# Patient Record
Sex: Female | Born: 1955 | Race: White | Hispanic: No | Marital: Married | Smoking: Never smoker
Health system: Southern US, Community
[De-identification: ages and names within clinical notes are randomized; demographics above are authoritative.]

## PROBLEM LIST (undated history)

## (undated) DIAGNOSIS — M1990 Unspecified osteoarthritis, unspecified site: Secondary | ICD-10-CM

## (undated) DIAGNOSIS — I8290 Acute embolism and thrombosis of unspecified vein: Secondary | ICD-10-CM

## (undated) HISTORY — PX: OTHER SURGICAL HISTORY: SHX169

## (undated) HISTORY — PX: GASTRIC BYPASS: SHX52

## (undated) HISTORY — PX: SHOULDER SURGERY: SHX246

---

## 2002-06-26 ENCOUNTER — Other Ambulatory Visit: Admission: RE | Admit: 2002-06-26 | Discharge: 2002-06-26 | Payer: Self-pay | Admitting: Obstetrics and Gynecology

## 2002-07-02 ENCOUNTER — Encounter: Payer: Self-pay | Admitting: Obstetrics and Gynecology

## 2002-07-02 ENCOUNTER — Ambulatory Visit (HOSPITAL_COMMUNITY): Admission: RE | Admit: 2002-07-02 | Discharge: 2002-07-02 | Payer: Self-pay | Admitting: Obstetrics and Gynecology

## 2003-08-12 ENCOUNTER — Ambulatory Visit (HOSPITAL_COMMUNITY): Admission: RE | Admit: 2003-08-12 | Discharge: 2003-08-12 | Payer: Self-pay | Admitting: Obstetrics and Gynecology

## 2003-08-24 ENCOUNTER — Other Ambulatory Visit: Admission: RE | Admit: 2003-08-24 | Discharge: 2003-08-24 | Payer: Self-pay | Admitting: Obstetrics and Gynecology

## 2003-08-28 ENCOUNTER — Ambulatory Visit (HOSPITAL_COMMUNITY): Admission: RE | Admit: 2003-08-28 | Discharge: 2003-08-28 | Payer: Self-pay | Admitting: Obstetrics and Gynecology

## 2003-09-21 ENCOUNTER — Ambulatory Visit (HOSPITAL_COMMUNITY): Admission: RE | Admit: 2003-09-21 | Discharge: 2003-09-21 | Payer: Self-pay | Admitting: Obstetrics and Gynecology

## 2003-09-22 ENCOUNTER — Encounter: Admission: RE | Admit: 2003-09-22 | Discharge: 2003-09-22 | Payer: Self-pay | Admitting: Family Medicine

## 2004-08-09 ENCOUNTER — Ambulatory Visit: Payer: Self-pay | Admitting: Family Medicine

## 2004-11-25 ENCOUNTER — Ambulatory Visit: Payer: Self-pay | Admitting: Family Medicine

## 2004-11-25 ENCOUNTER — Other Ambulatory Visit: Admission: RE | Admit: 2004-11-25 | Discharge: 2004-11-25 | Payer: Self-pay | Admitting: Family Medicine

## 2004-12-06 ENCOUNTER — Ambulatory Visit (HOSPITAL_COMMUNITY): Admission: RE | Admit: 2004-12-06 | Discharge: 2004-12-06 | Payer: Self-pay | Admitting: Family Medicine

## 2004-12-16 ENCOUNTER — Ambulatory Visit: Payer: Self-pay | Admitting: Family Medicine

## 2004-12-22 ENCOUNTER — Encounter: Admission: RE | Admit: 2004-12-22 | Discharge: 2005-03-22 | Payer: Self-pay | Admitting: Family Medicine

## 2006-01-25 ENCOUNTER — Ambulatory Visit: Payer: Self-pay | Admitting: Family Medicine

## 2006-05-24 ENCOUNTER — Ambulatory Visit: Payer: Self-pay | Admitting: Family Medicine

## 2006-06-13 ENCOUNTER — Ambulatory Visit (HOSPITAL_BASED_OUTPATIENT_CLINIC_OR_DEPARTMENT_OTHER): Admission: RE | Admit: 2006-06-13 | Discharge: 2006-06-13 | Payer: Self-pay | Admitting: Surgery

## 2006-06-17 ENCOUNTER — Ambulatory Visit: Payer: Self-pay | Admitting: Internal Medicine

## 2006-06-19 ENCOUNTER — Ambulatory Visit (HOSPITAL_COMMUNITY): Admission: RE | Admit: 2006-06-19 | Discharge: 2006-06-19 | Payer: Self-pay | Admitting: Surgery

## 2006-06-19 ENCOUNTER — Encounter: Admission: RE | Admit: 2006-06-19 | Discharge: 2006-09-17 | Payer: Self-pay | Admitting: Surgery

## 2006-06-20 ENCOUNTER — Ambulatory Visit (HOSPITAL_COMMUNITY): Admission: RE | Admit: 2006-06-20 | Discharge: 2006-06-20 | Payer: Self-pay | Admitting: Surgery

## 2006-07-04 ENCOUNTER — Other Ambulatory Visit: Admission: RE | Admit: 2006-07-04 | Discharge: 2006-07-04 | Payer: Self-pay | Admitting: Family Medicine

## 2006-07-04 ENCOUNTER — Encounter: Payer: Self-pay | Admitting: Family Medicine

## 2006-07-04 ENCOUNTER — Ambulatory Visit: Payer: Self-pay | Admitting: Family Medicine

## 2006-07-04 LAB — CONVERTED CEMR LAB
Albumin: 3.6 g/dL (ref 3.5–5.2)
Alkaline Phosphatase: 57 units/L (ref 39–117)
BUN: 10 mg/dL (ref 6–23)
Basophils Relative: 0.7 % (ref 0.0–1.0)
Bilirubin, Direct: 0.1 mg/dL (ref 0.0–0.3)
Calcium: 8.8 mg/dL (ref 8.4–10.5)
Eosinophils Relative: 1.3 % (ref 0.0–5.0)
GFR calc Af Amer: 86 mL/min
HCT: 41.5 % (ref 36.0–46.0)
Hemoglobin: 14.3 g/dL (ref 12.0–15.0)
MCV: 87.7 fL (ref 78.0–100.0)
Monocytes Absolute: 0.3 10*3/uL (ref 0.2–0.7)
Neutrophils Relative %: 68.8 % (ref 43.0–77.0)
Platelets: 275 10*3/uL (ref 150–400)
Potassium: 3.7 meq/L (ref 3.5–5.1)
RDW: 13.4 % (ref 11.5–14.6)
Sodium: 142 meq/L (ref 135–145)
T3, Free: 3.2 pg/mL (ref 2.3–4.2)
TSH: 1.79 microintl units/mL (ref 0.35–5.50)
Triglycerides: 105 mg/dL (ref 0–149)
VLDL: 21 mg/dL (ref 0–40)
WBC: 6.2 10*3/uL (ref 4.5–10.5)

## 2006-07-25 ENCOUNTER — Ambulatory Visit: Payer: Self-pay | Admitting: Family Medicine

## 2006-08-27 ENCOUNTER — Ambulatory Visit: Payer: Self-pay | Admitting: Family Medicine

## 2006-09-24 DIAGNOSIS — I868 Varicose veins of other specified sites: Secondary | ICD-10-CM | POA: Insufficient documentation

## 2006-09-24 DIAGNOSIS — N946 Dysmenorrhea, unspecified: Secondary | ICD-10-CM

## 2006-09-24 DIAGNOSIS — E785 Hyperlipidemia, unspecified: Secondary | ICD-10-CM

## 2006-09-25 ENCOUNTER — Encounter: Admission: RE | Admit: 2006-09-25 | Discharge: 2006-12-24 | Payer: Self-pay | Admitting: Surgery

## 2006-09-26 ENCOUNTER — Ambulatory Visit: Payer: Self-pay | Admitting: Family Medicine

## 2006-10-24 ENCOUNTER — Ambulatory Visit: Payer: Self-pay | Admitting: Family Medicine

## 2006-10-24 DIAGNOSIS — E669 Obesity, unspecified: Secondary | ICD-10-CM

## 2006-11-26 ENCOUNTER — Telehealth: Payer: Self-pay | Admitting: Family Medicine

## 2007-01-01 ENCOUNTER — Encounter: Admission: RE | Admit: 2007-01-01 | Discharge: 2007-01-01 | Payer: Self-pay | Admitting: Surgery

## 2007-01-04 ENCOUNTER — Telehealth (INDEPENDENT_AMBULATORY_CARE_PROVIDER_SITE_OTHER): Payer: Self-pay | Admitting: *Deleted

## 2007-01-07 ENCOUNTER — Encounter: Payer: Self-pay | Admitting: Family Medicine

## 2007-01-15 ENCOUNTER — Inpatient Hospital Stay (HOSPITAL_COMMUNITY): Admission: RE | Admit: 2007-01-15 | Discharge: 2007-01-17 | Payer: Self-pay | Admitting: Surgery

## 2007-01-22 ENCOUNTER — Encounter: Admission: RE | Admit: 2007-01-22 | Discharge: 2007-04-22 | Payer: Self-pay | Admitting: Surgery

## 2007-01-24 ENCOUNTER — Telehealth (INDEPENDENT_AMBULATORY_CARE_PROVIDER_SITE_OTHER): Payer: Self-pay | Admitting: *Deleted

## 2007-05-06 ENCOUNTER — Encounter: Admission: RE | Admit: 2007-05-06 | Discharge: 2007-05-07 | Payer: Self-pay | Admitting: Surgery

## 2007-06-27 ENCOUNTER — Ambulatory Visit (HOSPITAL_COMMUNITY): Admission: RE | Admit: 2007-06-27 | Discharge: 2007-06-27 | Payer: Self-pay | Admitting: Family Medicine

## 2007-07-08 ENCOUNTER — Encounter: Admission: RE | Admit: 2007-07-08 | Discharge: 2007-07-08 | Payer: Self-pay | Admitting: Family Medicine

## 2007-07-31 ENCOUNTER — Encounter: Admission: RE | Admit: 2007-07-31 | Discharge: 2007-07-31 | Payer: Self-pay | Admitting: Surgery

## 2007-08-19 ENCOUNTER — Telehealth (INDEPENDENT_AMBULATORY_CARE_PROVIDER_SITE_OTHER): Payer: Self-pay | Admitting: *Deleted

## 2007-11-12 ENCOUNTER — Other Ambulatory Visit: Admission: RE | Admit: 2007-11-12 | Discharge: 2007-11-12 | Payer: Self-pay | Admitting: Family Medicine

## 2007-11-12 ENCOUNTER — Telehealth: Payer: Self-pay | Admitting: Family Medicine

## 2007-11-12 ENCOUNTER — Encounter: Payer: Self-pay | Admitting: Family Medicine

## 2007-11-12 ENCOUNTER — Ambulatory Visit: Payer: Self-pay | Admitting: Family Medicine

## 2007-11-12 DIAGNOSIS — D239 Other benign neoplasm of skin, unspecified: Secondary | ICD-10-CM | POA: Insufficient documentation

## 2007-11-18 ENCOUNTER — Encounter (INDEPENDENT_AMBULATORY_CARE_PROVIDER_SITE_OTHER): Payer: Self-pay | Admitting: *Deleted

## 2007-11-20 ENCOUNTER — Ambulatory Visit: Payer: Self-pay | Admitting: Family Medicine

## 2007-11-21 ENCOUNTER — Telehealth: Payer: Self-pay | Admitting: Family Medicine

## 2007-11-21 LAB — CONVERTED CEMR LAB
ALT: 16 units/L (ref 0–35)
AST: 16 units/L (ref 0–37)
Albumin: 3.3 g/dL — ABNORMAL LOW (ref 3.5–5.2)
Basophils Absolute: 0 10*3/uL (ref 0.0–0.1)
CO2: 29 meq/L (ref 19–32)
Chloride: 107 meq/L (ref 96–112)
Creatinine, Ser: 0.9 mg/dL (ref 0.4–1.2)
Eosinophils Absolute: 0.1 10*3/uL (ref 0.0–0.7)
Eosinophils Relative: 1.6 % (ref 0.0–5.0)
GFR calc Af Amer: 85 mL/min
GFR calc non Af Amer: 70 mL/min
Glucose, Bld: 88 mg/dL (ref 70–99)
HDL: 42.8 mg/dL (ref >39.0)
Hemoglobin: 12.8 g/dL (ref 12.0–15.0)
Iron: 72 ug/dL (ref 42–145)
Lymphocytes Relative: 24.1 % (ref 12.0–46.0)
MCHC: 34.1 g/dL (ref 30.0–36.0)
Monocytes Relative: 3.9 % (ref 3.0–12.0)
Potassium: 3.7 meq/L (ref 3.5–5.1)
RBC: 4.21 M/uL (ref 3.87–5.11)
Saturation Ratios: 26.2 % (ref 20.0–50.0)
Sodium: 141 meq/L (ref 135–145)
Total Bilirubin: 0.6 mg/dL (ref 0.3–1.2)
VLDL: 16 mg/dL (ref 0–40)
Vitamin B-12: 361 pg/mL (ref 211–911)
WBC: 6.1 10*3/uL (ref 4.5–10.5)

## 2007-11-25 ENCOUNTER — Encounter: Payer: Self-pay | Admitting: Family Medicine

## 2007-11-26 ENCOUNTER — Ambulatory Visit: Payer: Self-pay | Admitting: Gastroenterology

## 2007-11-27 ENCOUNTER — Encounter: Payer: Self-pay | Admitting: Family Medicine

## 2007-12-09 ENCOUNTER — Ambulatory Visit: Payer: Self-pay | Admitting: Gastroenterology

## 2007-12-09 ENCOUNTER — Encounter: Payer: Self-pay | Admitting: Gastroenterology

## 2007-12-11 ENCOUNTER — Encounter: Payer: Self-pay | Admitting: Gastroenterology

## 2007-12-12 ENCOUNTER — Telehealth (INDEPENDENT_AMBULATORY_CARE_PROVIDER_SITE_OTHER): Payer: Self-pay | Admitting: *Deleted

## 2007-12-16 ENCOUNTER — Ambulatory Visit: Payer: Self-pay | Admitting: Family Medicine

## 2007-12-16 LAB — CONVERTED CEMR LAB: TSH: 1.38 microintl units/mL (ref 0.35–5.50)

## 2007-12-17 ENCOUNTER — Encounter (INDEPENDENT_AMBULATORY_CARE_PROVIDER_SITE_OTHER): Payer: Self-pay | Admitting: *Deleted

## 2007-12-19 ENCOUNTER — Encounter (INDEPENDENT_AMBULATORY_CARE_PROVIDER_SITE_OTHER): Payer: Self-pay | Admitting: *Deleted

## 2007-12-19 LAB — CONVERTED CEMR LAB: Vit D, 1,25-Dihydroxy: 26 — ABNORMAL LOW (ref 30–89)

## 2008-01-07 ENCOUNTER — Encounter: Admission: RE | Admit: 2008-01-07 | Discharge: 2008-02-11 | Payer: Self-pay | Admitting: Surgery

## 2008-03-04 ENCOUNTER — Ambulatory Visit: Payer: Self-pay | Admitting: Family Medicine

## 2008-03-06 ENCOUNTER — Telehealth (INDEPENDENT_AMBULATORY_CARE_PROVIDER_SITE_OTHER): Payer: Self-pay | Admitting: *Deleted

## 2008-03-18 ENCOUNTER — Encounter: Payer: Self-pay | Admitting: Family Medicine

## 2008-04-07 ENCOUNTER — Ambulatory Visit: Payer: Self-pay | Admitting: Family Medicine

## 2008-04-09 ENCOUNTER — Encounter (INDEPENDENT_AMBULATORY_CARE_PROVIDER_SITE_OTHER): Payer: Self-pay | Admitting: *Deleted

## 2008-05-11 ENCOUNTER — Telehealth (INDEPENDENT_AMBULATORY_CARE_PROVIDER_SITE_OTHER): Payer: Self-pay | Admitting: *Deleted

## 2008-08-12 ENCOUNTER — Encounter: Payer: Self-pay | Admitting: Family Medicine

## 2008-10-01 ENCOUNTER — Ambulatory Visit: Payer: Self-pay | Admitting: Diagnostic Radiology

## 2008-10-01 ENCOUNTER — Emergency Department (HOSPITAL_BASED_OUTPATIENT_CLINIC_OR_DEPARTMENT_OTHER): Admission: EM | Admit: 2008-10-01 | Discharge: 2008-10-01 | Payer: Self-pay | Admitting: Emergency Medicine

## 2008-10-27 ENCOUNTER — Ambulatory Visit (HOSPITAL_COMMUNITY): Admission: RE | Admit: 2008-10-27 | Discharge: 2008-10-27 | Payer: Self-pay | Admitting: Family Medicine

## 2008-12-02 ENCOUNTER — Ambulatory Visit: Payer: Self-pay | Admitting: Family Medicine

## 2008-12-02 ENCOUNTER — Other Ambulatory Visit: Admission: RE | Admit: 2008-12-02 | Discharge: 2008-12-02 | Payer: Self-pay | Admitting: Family Medicine

## 2008-12-02 ENCOUNTER — Encounter: Payer: Self-pay | Admitting: Family Medicine

## 2008-12-04 ENCOUNTER — Encounter (INDEPENDENT_AMBULATORY_CARE_PROVIDER_SITE_OTHER): Payer: Self-pay | Admitting: *Deleted

## 2008-12-08 ENCOUNTER — Encounter (INDEPENDENT_AMBULATORY_CARE_PROVIDER_SITE_OTHER): Payer: Self-pay | Admitting: *Deleted

## 2009-03-24 ENCOUNTER — Encounter: Payer: Self-pay | Admitting: Family Medicine

## 2009-03-30 ENCOUNTER — Encounter: Admission: RE | Admit: 2009-03-30 | Discharge: 2009-03-30 | Payer: Self-pay | Admitting: Sports Medicine

## 2009-04-26 ENCOUNTER — Encounter: Payer: Self-pay | Admitting: Family Medicine

## 2009-06-25 ENCOUNTER — Encounter: Payer: Self-pay | Admitting: Family Medicine

## 2009-09-22 ENCOUNTER — Encounter: Payer: Self-pay | Admitting: Family Medicine

## 2009-10-28 ENCOUNTER — Ambulatory Visit (HOSPITAL_COMMUNITY): Admission: RE | Admit: 2009-10-28 | Discharge: 2009-10-28 | Payer: Self-pay | Admitting: Family Medicine

## 2009-12-03 ENCOUNTER — Ambulatory Visit: Payer: Self-pay | Admitting: Family Medicine

## 2009-12-03 ENCOUNTER — Other Ambulatory Visit: Admission: RE | Admit: 2009-12-03 | Discharge: 2009-12-03 | Payer: Self-pay | Admitting: Family Medicine

## 2009-12-03 DIAGNOSIS — E559 Vitamin D deficiency, unspecified: Secondary | ICD-10-CM | POA: Insufficient documentation

## 2009-12-08 ENCOUNTER — Ambulatory Visit: Payer: Self-pay | Admitting: Family Medicine

## 2009-12-08 DIAGNOSIS — IMO0002 Reserved for concepts with insufficient information to code with codable children: Secondary | ICD-10-CM

## 2009-12-09 LAB — CONVERTED CEMR LAB
ALT: 12 units/L (ref 0–35)
Basophils Absolute: 0 10*3/uL (ref 0.0–0.1)
Basophils Relative: 0.4 % (ref 0.0–3.0)
Bilirubin, Direct: 0.1 mg/dL (ref 0.0–0.3)
Chloride: 104 meq/L (ref 96–112)
Eosinophils Absolute: 0.1 10*3/uL (ref 0.0–0.7)
GFR calc non Af Amer: 74.39 mL/min (ref >60)
Glucose, Bld: 95 mg/dL (ref 70–99)
Hemoglobin: 13.4 g/dL (ref 12.0–15.0)
MCV: 91.4 fL (ref 78.0–100.0)
Neutro Abs: 4.9 10*3/uL (ref 1.4–7.7)
Neutrophils Relative %: 72 % (ref 43.0–77.0)
Platelets: 228 10*3/uL (ref 150.0–400.0)
RDW: 14.1 % (ref 11.5–14.6)
Total Protein: 6.5 g/dL (ref 6.0–8.3)
WBC: 6.8 10*3/uL (ref 4.5–10.5)

## 2009-12-10 LAB — CONVERTED CEMR LAB: Rhuematoid fact SerPl-aCnc: <20 intl units/mL (ref 0–20)

## 2009-12-13 ENCOUNTER — Encounter: Admission: RE | Admit: 2009-12-13 | Discharge: 2009-12-13 | Payer: Self-pay | Admitting: Family Medicine

## 2009-12-14 ENCOUNTER — Telehealth (INDEPENDENT_AMBULATORY_CARE_PROVIDER_SITE_OTHER): Payer: Self-pay | Admitting: *Deleted

## 2009-12-29 ENCOUNTER — Telehealth: Payer: Self-pay | Admitting: Family Medicine

## 2010-01-06 ENCOUNTER — Encounter: Payer: Self-pay | Admitting: Family Medicine

## 2010-01-31 ENCOUNTER — Encounter: Payer: Self-pay | Admitting: Family Medicine

## 2010-06-12 ENCOUNTER — Encounter: Payer: Self-pay | Admitting: Surgery

## 2010-06-19 LAB — CONVERTED CEMR LAB
ALT: 17 units/L (ref 0–35)
ALT: 20 units/L (ref 0–35)
AST: 23 units/L (ref 0–37)
AST: 23 units/L (ref 0–37)
Albumin: 3.8 g/dL (ref 3.5–5.2)
Alkaline Phosphatase: 69 units/L (ref 39–117)
Basophils Absolute: 0 10*3/uL (ref 0.0–0.1)
Basophils Absolute: 0 10*3/uL (ref 0.0–0.1)
Basophils Relative: 0.1 % (ref 0.0–3.0)
Basophils Relative: 0.5 % (ref 0.0–3.0)
Bilirubin Urine: NEGATIVE
Bilirubin, Direct: 0.2 mg/dL (ref 0.0–0.3)
Bilirubin, Direct: 0.2 mg/dL (ref 0.0–0.3)
CO2: 29 meq/L (ref 19–32)
Chloride: 108 meq/L (ref 96–112)
Eosinophils Absolute: 0.1 10*3/uL (ref 0.0–0.7)
Eosinophils Relative: 0.6 % (ref 0.0–5.0)
Eosinophils Relative: 0.9 % (ref 0.0–5.0)
Ferritin: 64.9 ng/mL (ref 10.0–291.0)
Folate: >20 ng/mL
Glucose, Bld: 87 mg/dL (ref 70–99)
Glucose, Bld: 97 mg/dL (ref 70–99)
Glucose, Urine, Semiquant: NEGATIVE
Iron: 103 ug/dL (ref 42–145)
Ketones, urine, test strip: NEGATIVE
Lymphocytes Relative: 18.8 % (ref 12.0–46.0)
Lymphs Abs: 1.4 10*3/uL (ref 0.7–4.0)
MCHC: 33.9 g/dL (ref 30.0–36.0)
MCHC: 34.7 g/dL (ref 30.0–36.0)
MCV: 92.2 fL (ref 78.0–100.0)
Monocytes Absolute: 0.3 10*3/uL (ref 0.1–1.0)
Monocytes Relative: 3.4 % (ref 3.0–12.0)
Monocytes Relative: 4.7 % (ref 3.0–12.0)
Neutro Abs: 4.3 10*3/uL (ref 1.4–7.7)
Neutrophils Relative %: 77.1 % — ABNORMAL HIGH (ref 43.0–77.0)
Pap Smear: NEGATIVE
Platelets: 218 10*3/uL (ref 150.0–400.0)
Potassium: 3.9 meq/L (ref 3.5–5.1)
RBC: 4.13 M/uL (ref 3.87–5.11)
RDW: 13.8 % (ref 11.5–14.6)
Sodium: 141 meq/L (ref 135–145)
TSH: 1.28 microintl units/mL (ref 0.35–5.50)
Total Bilirubin: 1 mg/dL (ref 0.3–1.2)
Total CHOL/HDL Ratio: 3
Total Protein: 6.8 g/dL (ref 6.0–8.3)
Transferrin: 228.7 mg/dL (ref 212.0–360.0)
Triglycerides: 57 mg/dL (ref 0.0–149.0)
Urobilinogen, UA: NEGATIVE
Vit D, 25-Hydroxy: 27 ng/mL — ABNORMAL LOW (ref 30–89)
WBC: 6 10*3/uL (ref 4.5–10.5)

## 2010-06-21 NOTE — Letter (Signed)
Summary: Waiting on Records/[facility]  Waiting on Records/ Orthopaedics   Imported By: Lanelle Bal 01/13/2010 10:17:26  _____________________________________________________________________  External Attachment:    Type:   Image     Comment:   External Document

## 2010-06-21 NOTE — Assessment & Plan Note (Signed)
Summary: cpx & lab//cbs   Vital Signs:  Patient profile:   55 year old female Height:      64.6 inches Weight:      221 pounds BMI:     37.37 Temp:     98.3 degrees F oral Pulse rate:   72 / minute Resp:     18 per minute BP sitting:   90 / 52  (left arm)  Vitals Entered By: Jeremy Johann CMA (December 03, 2009 8:22 AM) CC: cpx,fasting, pap Comments REVIEWED MED LIST, PATIENT AGREED DOSE AND INSTRUCTION CORRECT    History of Present Illness: Pt here for cpe and labs  and pap.   No complaints.     Preventive Screening-Counseling & Management  Alcohol-Tobacco     Alcohol drinks/day: <1     Smoking Status: never  Caffeine-Diet-Exercise     Caffeine use/day: 0     Does Patient Exercise: yes     Type of exercise: boot camp, treadmill     Exercise (avg: min/session): 30-60     Times/week: 4  Hep-HIV-STD-Contraception     Dental Visit-last 6 months yes     Dental Care Counseling: not indicated; dental care within six months     SBE monthly: yes      Sexual History:  currently monogamous.    Current Medications (verified): 1)  Vitamin D 08657 Unit  Caps (Ergocalciferol) .... Take One Capsule Weekly 2)  Vitamin D .... Once Daily 3)  B12 .... Once Daily 4)  Vitamin D .... Once Daily 5)  Magnesium .Marland Kitchen.. 3 Tab Once Daily 6)  Iron .... Once Daily  Allergies (verified): No Known Drug Allergies  Past History:  Past Medical History: Last updated: 09/24/2006 Hyperlipidemia  Family History: Last updated: 10/24/2006 Family History Breast cancer 1st degree relative <50 Family History of CAD Female 1st degree relative <60 Family History Diabetes 1st degree relative  Social History: Last updated: 10/24/2006 Never Smoked Alcohol use-no Drug use-no Regular exercise-yes  Risk Factors: Alcohol Use: <1 (12/03/2009) Caffeine Use: 0 (12/03/2009) Exercise: yes (12/03/2009)  Risk Factors: Smoking Status: never (12/03/2009)  Past Surgical History: Gastric bypass  (01/15/2007) Caesarean section labrum tear L shoulder -- Dr Supple--01/2010  Family History: Reviewed history from 10/24/2006 and no changes required. Family History Breast cancer 1st degree relative <50 Family History of CAD Female 1st degree relative <60 Family History Diabetes 1st degree relative  Social History: Reviewed history from 10/24/2006 and no changes required. Never Smoked Alcohol use-no Drug use-no Regular exercise-yes  Review of Systems      See HPI General:  Denies chills, fatigue, fever, loss of appetite, malaise, sleep disorder, sweats, weakness, and weight loss. Eyes:  Denies blurring, discharge, double vision, eye irritation, eye pain, halos, itching, light sensitivity, red eye, vision loss-1 eye, and vision loss-both eyes; optho--q1y. ENT:  Denies decreased hearing, difficulty swallowing, ear discharge, earache, hoarseness, nasal congestion, nosebleeds, postnasal drainage, ringing in ears, sinus pressure, and sore throat. CV:  Denies bluish discoloration of lips or nails, chest pain or discomfort, difficulty breathing at night, difficulty breathing while lying down, fainting, fatigue, leg cramps with exertion, lightheadness, near fainting, palpitations, shortness of breath with exertion, swelling of feet, swelling of hands, and weight gain. Resp:  Denies chest discomfort, chest pain with inspiration, cough, coughing up blood, excessive snoring, hypersomnolence, morning headaches, pleuritic, shortness of breath, sputum productive, and wheezing. GI:  Denies abdominal pain, bloody stools, change in bowel habits, constipation, dark tarry stools, diarrhea, excessive appetite, gas, hemorrhoids,  indigestion, loss of appetite, nausea, vomiting, vomiting blood, and yellowish skin color. GU:  Denies abnormal vaginal bleeding, decreased libido, discharge, dysuria, genital sores, hematuria, incontinence, nocturia, urinary frequency, and urinary hesitancy. MS:  Complains of joint  pain and low back pain. Derm:  Denies changes in color of skin, changes in nail beds, dryness, excessive perspiration, flushing, hair loss, insect bite(s), itching, lesion(s), poor wound healing, and rash. Neuro:  Denies brief paralysis, difficulty with concentration, disturbances in coordination, falling down, headaches, inability to speak, memory loss, numbness, poor balance, seizures, sensation of room spinning, tingling, tremors, visual disturbances, and weakness. Psych:  Denies alternate hallucination ( auditory/visual), anxiety, depression, easily angered, easily tearful, irritability, mental problems, panic attacks, sense of great danger, suicidal thoughts/plans, thoughts of violence, unusual visions or sounds, and thoughts /plans of harming others. Endo:  Denies cold intolerance, excessive hunger, excessive thirst, excessive urination, heat intolerance, polyuria, and weight change. Heme:  Denies abnormal bruising, bleeding, enlarge lymph nodes, fevers, pallor, and skin discoloration. Allergy:  Denies hives or rash, itching eyes, persistent infections, seasonal allergies, and sneezing.  Physical Exam  General:  Well-developed,well-nourished,in no acute distress; alert,appropriate and cooperative throughout examination Head:  Normocephalic and atraumatic without obvious abnormalities. No apparent alopecia or balding. Eyes:  vision grossly intact, pupils equal, pupils round, pupils reactive to light, and no injection.   Ears:  External ear exam shows no significant lesions or deformities.  Otoscopic examination reveals clear canals, tympanic membranes are intact bilaterally without bulging, retraction, inflammation or discharge. Hearing is grossly normal bilaterally. Nose:  External nasal examination shows no deformity or inflammation. Nasal mucosa are pink and moist without lesions or exudates. Mouth:  Oral mucosa and oropharynx without lesions or exudates.  Teeth in good repair. Neck:  No  deformities, masses, or tenderness noted.no cervical lymphadenopathy.   Chest Wall:  No deformities, masses, or tenderness noted. Breasts:  No mass, nodules, thickening, tenderness, bulging, retraction, inflamation, nipple discharge or skin changes noted.   Lungs:  Normal respiratory effort, chest expands symmetrically. Lungs are clear to auscultation, no crackles or wheezes. Heart:  normal rate and no murmur.   Abdomen:  Bowel sounds positive,abdomen soft and non-tender without masses, organomegaly or hernias noted. Rectal:  No external abnormalities noted. Normal sphincter tone. No rectal masses or tenderness.  Heme negative brown stool. Genitalia:  Pelvic Exam:        External: normal female genitalia without lesions or masses        Vagina: normal without lesions or masses        Cervix: normal without lesions or masses        Adnexa: normal bimanual exam without masses or fullness        Uterus: normal by palpation        Pap smear: performed Msk:  normal ROM, no joint swelling, no joint warmth, and no redness over joints.   Low back pain and knee pain Pulses:  R posterior tibial normal, R dorsalis pedis normal, R carotid normal, L posterior tibial normal, L dorsalis pedis normal, and L carotid normal.   Extremities:  No clubbing, cyanosis, edema, or deformity noted with normal full range of motion of all joints.   Neurologic:  No cranial nerve deficits noted. Station and gait are normal. Plantar reflexes are down-going bilaterally. DTRs are symmetrical throughout. Sensory, motor and coordinative functions appear intact. Skin:  Intact without suspicious lesions or rashes Cervical Nodes:  No lymphadenopathy noted Axillary Nodes:  No palpable lymphadenopathy Psych:  Cognition  and judgment appear intact. Alert and cooperative with normal attention span and concentration. No apparent delusions, illusions, hallucinations   Impression & Recommendations:  Problem # 1:  PREVENTIVE HEALTH CARE  (ICD-V70.0) ghm utd  Orders: Venipuncture (16109) TLB-Lipid Panel (80061-LIPID) TLB-BMP (Basic Metabolic Panel-BMET) (80048-METABOL) TLB-CBC Platelet - w/Differential (85025-CBCD) TLB-Hepatic/Liver Function Pnl (80076-HEPATIC) TLB-TSH (Thyroid Stimulating Hormone) (84443-TSH) EKG w/ Interpretation (93000) Specimen Handling (60454) UA Dipstick w/o Micro (manual) (81002)  Problem # 2:  UNSPECIFIED VITAMIN D DEFICIENCY (ICD-268.9)  Orders: T-Vitamin D (25-Hydroxy) (09811-91478)  Problem # 3:  MOLE (ICD-216.9)  Orders: Dermatology Referral (Derma)  Problem # 4:  OBESITY NOS (ICD-278.00)  Ht: 64.6 (12/03/2009)   Wt: 221 (12/03/2009)   BMI: 37.37 (12/03/2009)  Problem # 5:  VARICOSE VEIN (ICD-456.8)  Problem # 6:  HYPERLIPIDEMIA (ICD-272.4)  Orders: Venipuncture (29562) TLB-Lipid Panel (80061-LIPID) TLB-BMP (Basic Metabolic Panel-BMET) (80048-METABOL) TLB-CBC Platelet - w/Differential (85025-CBCD) TLB-Hepatic/Liver Function Pnl (80076-HEPATIC) TLB-TSH (Thyroid Stimulating Hormone) (84443-TSH) EKG w/ Interpretation (93000)  Labs Reviewed: SGOT: 23 (12/02/2008)   SGPT: 20 (12/02/2008)   HDL:69.30 (12/02/2008), 42.8 (11/20/2007)  LDL:98 (12/02/2008), 90 (11/20/2007)  Chol:176 (12/02/2008), 149 (11/20/2007)  Trig:46.0 (12/02/2008), 82 (11/20/2007)  Complete Medication List: 1)  Vitamin D 13086 Unit Caps (Ergocalciferol) .... Take one capsule weekly 2)  Vitamin D  .... Once daily 3)  B12  .... Once daily 4)  Vitamin D  .... Once daily 5)  Magnesium  .Marland Kitchen.. 3 tab once daily 6)  Iron  .... Once daily   EKG  Procedure date:  12/03/2009  Findings:      Normal sinus rhythm with rate of:  63 bpm     Immunization History:  Tetanus/Td Immunization History:    Tetanus/Td:  Historical (12/04/2007)     Laboratory Results   Urine Tests   Date/Time Reported: December 03, 2009 10:20 AM   Routine Urinalysis   Color: yellow Appearance: Clear Glucose: negative    (Normal Range: Negative) Bilirubin: negative   (Normal Range: Negative) Ketone: negative   (Normal Range: Negative) Spec. Gravity: <1.005   (Normal Range: 1.003-1.035) Blood: negative   (Normal Range: Negative) pH: 7.0   (Normal Range: 5.0-8.0) Protein: negative   (Normal Range: Negative) Urobilinogen: negative   (Normal Range: 0-1) Nitrite: negative   (Normal Range: Negative) Leukocyte Esterace: negative   (Normal Range: Negative)    Comments: Floydene Flock  December 03, 2009 10:20 AM

## 2010-06-21 NOTE — Consult Note (Signed)
Summary: [facility]  [facility]   Imported By: Lanelle Bal 02/14/2010 08:40:41  _____________________________________________________________________  External Attachment:    Type:   Image     Comment:   External Document

## 2010-06-21 NOTE — Assessment & Plan Note (Signed)
Summary: back pain//kn   Vital Signs:  Patient profile:   55 year old female Height:      64.6 inches Weight:      219 pounds BMI:     37.03 Temp:     98.5 degrees F oral Pulse rate:   72 / minute BP sitting:   100 / 58  (left arm)  Vitals Entered By: Jeremy Johann CMA (December 08, 2009 10:55 AM) CC: back pain need clearance, Back Pain   History of Present Illness:       This is a 55 year old woman who presents with Back Pain.  The symptoms began >1 year ago.  Pt had back pain for years but has been progressively getting worse over the last 2 months.  The patient denies fever, chills, weakness, loss of sensation, fecal incontinence, urinary incontinence, urinary retention, dysuria, rest pain, inability to work, and inability to care for self.  The pain is located in the right low back.  The pain began gradually, after lifting, after a fall, and after overuse.  The pain radiates to the right hip.  The pain is made worse by lying down.  The pain is made better by activity and opioids.  Risk factors for serious underlying conditions include duration of pain > 1 month.  Pt has seen chiropractor 2 x but he is unable to do anything secondary to pain.    Current Medications (verified): 1)  Vitamin D3 2000 Unit Tabs (Cholecalciferol) .... Take 1 Tab Once Daily 2)  Vitamin B-12 1000 Mcg Tabs (Cyanocobalamin) .... Take 1 Tab Once Daily 3)  Magnesium 133 Mg .Marland Kitchen.. 3 Tab Once Daily 4)  Iron .... Once Daily 5)  Alprazolam 0.25 Mg Tabs (Alprazolam) .Marland Kitchen.. 1 By Mouth Three Times A Day As Needed 6)  Vicodin Es 7.5-750 Mg Tabs (Hydrocodone-Acetaminophen) .Marland Kitchen.. 1 By Mouth Every 6 Hours As Needed  Allergies (verified): No Known Drug Allergies  Past History:  Past medical, surgical, family and social histories (including risk factors) reviewed for relevance to current acute and chronic problems.  Past Medical History: Reviewed history from 09/24/2006 and no changes required. Hyperlipidemia  Past  Surgical History: Reviewed history from 12/03/2009 and no changes required. Gastric bypass (01/15/2007) Caesarean section labrum tear L shoulder -- Dr Supple--01/2010  Family History: Reviewed history from 10/24/2006 and no changes required. Family History Breast cancer 1st degree relative <50 Family History of CAD Female 1st degree relative <60 Family History Diabetes 1st degree relative  Social History: Reviewed history from 10/24/2006 and no changes required. Never Smoked Alcohol use-no Drug use-no Regular exercise-yes  Review of Systems      See HPI  Physical Exam  General:  Well-developed,well-nourished,in no acute distress; alert,appropriate and cooperative throughout examinationoverweight-appearing.   Extremities:  No clubbing, cyanosis, edema, or deformity noted with normal full range of motion of all joints.   Neurologic:  alert & oriented X3, cranial nerves II-XII intact, strength normal in all extremities, gait normal, and DTRs symmetrical and normal.  pt with good strength in legs and hips but is unable to lie flat secondary to pain.      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 Take 1 tab once daily 2)  Vitamin B-12 1000 Mcg Tabs (Cyanocobalamin) .... Take 1 tab once daily 3)  Magnesium 133 Mg  .Marland Kitchen.. 3 tab once daily 4)  Iron  .... Once daily 5)  Alprazolam 0.25 Mg Tabs (Alprazolam) .Marland Kitchen.. 1 by mouth three times a day as needed 6)  Vicodin Es 7.5-750 Mg Tabs (Hydrocodone-acetaminophen) .Marland Kitchen.. 1 by mouth every 6  hours as needed Prescriptions: VICODIN ES 7.5-750 MG TABS (HYDROCODONE-ACETAMINOPHEN) 1 by mouth every 6 hours as needed  #30 x 0   Entered and Authorized by:   Loreen Freud DO   Signed by:   Loreen Freud DO on 12/08/2009   Method used:   Print then Give to Patient   RxID:   [id number] ALPRAZOLAM 0.25 MG TABS (ALPRAZOLAM) 1 by mouth three times a day as needed  #5 x 0   Entered and Authorized by:   Loreen Freud DO   Signed by:   Loreen Freud DO on 12/08/2009   Method used:   Print then Give to Patient   RxID:   [id number]

## 2010-06-21 NOTE — Letter (Signed)
Summary: Care Consideration Regarding A1C Monitoring/[employer]  Care Consideration Regarding A1C Monitoring/[employer]   Imported By: Lanelle Bal 10/05/2009 13:23:21  _____________________________________________________________________  External Attachment:    Type:   Image     Comment:   External Document

## 2010-06-21 NOTE — Progress Notes (Signed)
Summary: MR SPINE  Phone Note Outgoing Call   Call placed by: Jeremy Johann CMA,  December 14, 2009 4:57 PM Details for Reason: degenerative changes Low back no disc herniation refer to ortho if no improvement Summary of Call: left message to call  office.........Marland KitchenFelecia Deloach CMA  December 14, 2009 4:57 PM   left message to call back to office. Lucious Groves CMA  December 15, 2009 2:11 PM   Follow-up for Phone Call        PT CALLED AND WOULD LIKE TO GIVE TWO PHONE NUMBERS THAT SHE CAN BE CONTACTED AT TODAY. [phone] AND [phone]. PT STATES PHONE NUMBER ON FILE IS NOT WORKING TODAY. Initial call taken by: Lavell Islam,  December 16, 2009 8:50 AM  Additional Follow-up for Phone Call Additional follow up Details #1::        DISCUSS WITH PATIENT ................Marland KitchenFelecia Deloach CMA  December 16, 2009 9:45 AM

## 2010-06-21 NOTE — Progress Notes (Signed)
Summary: ref -   Phone Note Call from Patient Call back at Home Phone [phone]   Caller: Patient Summary of Call: PATIENT HAS BEEN SEENING  chiro paul spell - she said her back is still hurting - he suggested she  may want to be referred to rheumo - dr Laury Axon suggested ortho - patient just wants referred to see what is causing pain Initial call taken by: Okey Regal [place],  December 29, 2009 2:20 PM  Follow-up for Phone Call        ortho referral put in---someone put in neurosurgical referral yesterday---- nothing Neurosurgical on MRI Follow-up by: Loreen Freud DO,  December 30, 2009 5:39 PM  Additional Follow-up for Phone Call Additional follow up Details #1::        PT AWARE REFERRAL PUT IN.............Marland KitchenFelecia Deloach CMA  December 31, 2009 8:47 AM

## 2010-06-21 NOTE — Letter (Signed)
Summary: Letter Regarding Diabetes/[employer]  Letter Regarding Diabetes/[employer]   Imported By: Lanelle Bal 07/27/2009 12:03:03  _____________________________________________________________________  External Attachment:    Type:   Image     Comment:   External Document
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
## 2010-10-07 NOTE — Procedures (Signed)
NAMESHITAL, CRAYTON NO.:  [id number]   MEDICAL RECORD NO.:  [id number]          PATIENT TYPE:  OUT   LOCATION:  SLEEP CENTER                 FACILITY:  [facility]   PHYSICIAN:  Clinton D. Maple Hudson, MD, FCCP, FACPDATE OF BIRTH:  05/24/1956   DATE OF STUDY:                            NOCTURNAL POLYSOMNOGRAM   INDICATION FOR STUDY:  Hypersomnia with sleep apnea.   EPWORTH SLEEPINESS SCORE:  6/24, BMI 53.9, weight 305 pounds.   MEDICATIONS:  No home medications were reported.   SLEEP ARCHITECTURE:  Total sleep time 315 minutes with sleep efficiency  86%.  Stage I was 6%, stage II 71%, stages III and IV 7%, REM 16% of  total sleep time.  Sleep latency 40 minutes, REM latency 154 minutes,  awake after sleep onset 14 minutes, arousal index 18.4.  No bedtime  medication was taken.   RESPIRATORY DATA:  Apnea-hypopnea index (AHI, RDI) 11.8 per hour,  indicating mild obstructive sleep apnea/hypopnea syndrome.  There were  20 obstructive apneas and 42 hypopneas.  Events were not positional.  REM AHI 60.6 per hour.  There were insufficient events to qualify for  CPAP titration by split study protocol on this study night.   OXYGEN DATA:  Moderate snoring (mouth breather) with oxygen  desaturation to a nadir of 82%.  Mean oxygen saturation through the  study was 90% on room air.  Baseline awake room air saturation was 94%.   CARDIAC DATA:  Normal sinus rhythm.   MOVEMENT-PARASOMNIA:  Occasional limb jerk, insignificant.   IMPRESSIONS-RECOMMENDATIONS:  1. Mild obstructive sleep apnea/hypopnea syndrome, apnea-hypopnea      index 11.8 per hour with non-positional events, moderate snoring      and oxygen desaturation to a nadir of 82%.  2. There were insufficient events to permit CPAP titration by split      protocol on this study night.  If surgery is anticipated, consider      empiric CPAP at 10 CWP in the immediate postoperative interval.      Ultimately, weight loss would be  strongly advised as a therapy for      sleep apnea.  3. Mean oxygen saturation was 90% through the study while on room air.      Baseline awake saturation was normal      at 94%, suggesting mild hypoventilation during sleep.  Is there a      history of cardiopulmonary disease otherwise?      Clinton D. Maple Hudson, MD, [facility], FACP  Diplomate, Biomedical engineer of Sleep Medicine  Electronically Signed     CDY/MEDQ  D:  06/17/2006 09:58:32  T:  06/17/2006 17:52:57  Job:  [id number]

## 2010-10-07 NOTE — Letter (Signed)
May 08, 2006    Terri Stevenson. Terri Deutscher, MD  [facility]  [address], [address]  [place], [place] [address]   RE:  Terri Stevenson  MRN:  [id number]  /  DOB:  08-26-1955   Dear Terri Stevenson,   Terri Stevenson has come to me interested in gastric bypass surgery.  She is a 55 year old, white female who has struggled with obesity her  whole life.  She has tried many weight loss plans with low success such  as Weight Watchers, [employer],  [employer], [employer],  and even counting calories and watching her carbohydrates.  She has a  past medical history of high cholesterol which she is not taking any  medicine for at the present time, dysmenorrhea, varicose veins.  Past  family history is significant for a brother having type 2 diabetes.  Her  mother died at 61 of breast cancer.  Father died in his 92s of COPD  complications and has a paternal grandmother who died of heart problems.   In 2003-10-24the patient weighed 294.5 with a BMI of 49.5.  In  August 2004, she weighed 273, which is a BMI of 45.8.  In June 2005, she  weighed 274 and her BMI at 45.9.  In March 2006, she weighed 286 with a  BMI of 48.  In September 2007, she weighed 305.8 with a BMI of  51.3.  The patient has done a lot of research on her own and has been to  the informational session that was put on and expresses an interest in  having you do her surgery.  I think Terri Stevenson will be an excellent  candidate for gastric bypass surgery and please feel free to contact me  with any further questions.    Sincerely,      Terri Perla, DO  Electronically Signed    Terri Stevenson  DD: 05/08/2006  DT: 05/08/2006  Job #: [id number]

## 2010-11-02 ENCOUNTER — Other Ambulatory Visit (HOSPITAL_COMMUNITY): Payer: Self-pay | Admitting: Obstetrics and Gynecology

## 2010-11-02 DIAGNOSIS — Z1231 Encounter for screening mammogram for malignant neoplasm of breast: Secondary | ICD-10-CM

## 2010-11-29 ENCOUNTER — Ambulatory Visit (HOSPITAL_COMMUNITY)
Admission: RE | Admit: 2010-11-29 | Discharge: 2010-11-29 | Disposition: A | Discharge status: Home / Self Care | Payer: Managed Care, Other (non HMO) | Source: Ambulatory Visit | Attending: Obstetrics and Gynecology | Admitting: Obstetrics and Gynecology

## 2010-11-29 DIAGNOSIS — Z1231 Encounter for screening mammogram for malignant neoplasm of breast: Secondary | ICD-10-CM | POA: Insufficient documentation

## 2010-12-02 ENCOUNTER — Other Ambulatory Visit: Payer: Self-pay | Admitting: Obstetrics and Gynecology

## 2010-12-02 DIAGNOSIS — R928 Other abnormal and inconclusive findings on diagnostic imaging of breast: Secondary | ICD-10-CM

## 2010-12-07 ENCOUNTER — Ambulatory Visit
Admission: RE | Admit: 2010-12-07 | Discharge: 2010-12-07 | Disposition: A | Discharge status: Home / Self Care | Payer: Managed Care, Other (non HMO) | Source: Ambulatory Visit | Attending: Obstetrics and Gynecology | Admitting: Obstetrics and Gynecology

## 2010-12-07 DIAGNOSIS — R928 Other abnormal and inconclusive findings on diagnostic imaging of breast: Secondary | ICD-10-CM

## 2011-03-03 LAB — BASIC METABOLIC PANEL
Calcium: 9.3
Creatinine, Ser: 0.9
GFR calc Af Amer: >60
GFR calc non Af Amer: >60
Sodium: 141

## 2011-03-03 LAB — PREGNANCY, URINE: Preg Test, Ur: NEGATIVE

## 2011-03-03 LAB — DIFFERENTIAL
Basophils Absolute: 0
Basophils Absolute: 0
Basophils Relative: 0
Basophils Relative: 0
Eosinophils Absolute: 0
Eosinophils Relative: 0
Lymphs Abs: 0.4 — ABNORMAL LOW
Monocytes Absolute: 0.4
Neutro Abs: 7.1
Neutrophils Relative %: 93 — ABNORMAL HIGH

## 2011-03-03 LAB — CBC
HCT: 35.8 — ABNORMAL LOW
Hemoglobin: 11.6 — ABNORMAL LOW
MCHC: 33.7
MCHC: 34.2
MCV: 86.2
Platelets: 214
Platelets: 218
RDW: 14.5 — ABNORMAL HIGH
RDW: 14.6 — ABNORMAL HIGH

## 2011-03-03 LAB — HEMOGLOBIN AND HEMATOCRIT, BLOOD
HCT: 40.4
Hemoglobin: 13
Hemoglobin: 13.7

## 2011-10-18 ENCOUNTER — Ambulatory Visit (INDEPENDENT_AMBULATORY_CARE_PROVIDER_SITE_OTHER): Payer: Managed Care, Other (non HMO) | Admitting: Family Medicine

## 2011-10-18 ENCOUNTER — Encounter: Payer: Self-pay | Admitting: Family Medicine

## 2011-10-18 ENCOUNTER — Ambulatory Visit (HOSPITAL_BASED_OUTPATIENT_CLINIC_OR_DEPARTMENT_OTHER)
Admission: RE | Admit: 2011-10-18 | Discharge: 2011-10-18 | Disposition: A | Discharge status: Home / Self Care | Payer: Managed Care, Other (non HMO) | Source: Ambulatory Visit | Attending: Family Medicine | Admitting: Family Medicine

## 2011-10-18 VITALS — BP 120/82 | HR 83 | Temp 98.0°F | Resp 20 | Wt 246.0 lb

## 2011-10-18 DIAGNOSIS — R0602 Shortness of breath: Secondary | ICD-10-CM | POA: Insufficient documentation

## 2011-10-18 DIAGNOSIS — R0789 Other chest pain: Secondary | ICD-10-CM | POA: Insufficient documentation

## 2011-10-18 DIAGNOSIS — J45990 Exercise induced bronchospasm: Secondary | ICD-10-CM

## 2011-10-18 LAB — BASIC METABOLIC PANEL
Calcium: 8.8 mg/dL (ref 8.4–10.5)
GFR: 84.05 mL/min (ref >60.00)
Glucose, Bld: 97 mg/dL (ref 70–99)
Sodium: 140 mEq/L (ref 135–145)

## 2011-10-18 LAB — HEPATIC FUNCTION PANEL
AST: 17 U/L (ref 0–37)
Albumin: 3.5 g/dL (ref 3.5–5.2)
Alkaline Phosphatase: 85 U/L (ref 39–117)

## 2011-10-18 LAB — CBC WITH DIFFERENTIAL/PLATELET
Basophils Absolute: 0 10*3/uL (ref 0.0–0.1)
Hemoglobin: 13.3 g/dL (ref 12.0–15.0)
Lymphocytes Relative: 21.6 % (ref 12.0–46.0)
Monocytes Relative: 4.6 % (ref 3.0–12.0)
Neutro Abs: 4.5 10*3/uL (ref 1.4–7.7)
RBC: 4.58 Mil/uL (ref 3.87–5.11)
RDW: 14.7 % — ABNORMAL HIGH (ref 11.5–14.6)

## 2011-10-18 MED ORDER — ALBUTEROL SULFATE HFA 108 (90 BASE) MCG/ACT IN AERS: 2.0000 | INHALATION_SPRAY | Freq: Four times a day (QID) | RESPIRATORY_TRACT | Status: DC | PRN

## 2011-10-18 MED ORDER — BECLOMETHASONE DIPROPIONATE 40 MCG/ACT IN AERS: 2.0000 | INHALATION_SPRAY | Freq: Two times a day (BID) | RESPIRATORY_TRACT | Status: DC

## 2011-10-18 NOTE — Patient Instructions (Addendum)
Shortness of Breath Shortness of breath (dyspnea) is the feeling of uneasy breathing. Shortness of breath does not always mean that there is a life-threatening illness. However, shortness of breath requires immediate medical care. CAUSES  Causes for shortness of breath include:  Not enough oxygen in the air (as with high altitudes or with a smoke-filled room).   Short-term (acute) lung disease, including:   Infections such as pneumonia.   Fluid in the lungs, such as heart failure.   A blood clot in the lungs (pulmonary embolism).   Lasting (chronic) lung diseases.   Heart disease (heart attack, angina, heart failure, and others).   Low red blood cells (anemia).   Poor physical fitness. This can cause shortness of breath when you exercise.   Chest or back injuries or stiffness.   Being overweight (obese).   Anxiety. This can make you feel like you are not getting enough air.  DIAGNOSIS  Serious medical problems can usually be found during your physical exam. Many tests may also be done to determine why you are having shortness of breath. Tests include:  Chest X-rays.   Lung function tests.   Blood tests.   Electrocardiography.   Exercise testing.   A cardiac echo.   Imaging scans.  Your caregiver may not be able to find a cause for your shortness of breath after your exam. In this case, it is important to have a follow-up exam with your caregiver as directed.  HOME CARE INSTRUCTIONS   Do not smoke. Smoking is a common cause of shortness of breath. Ask for help to stop smoking.   Avoid being around chemicals that may bother your breathing (paint fumes, dust).   Rest as needed. Slowly resume your usual activities.   If medicines were prescribed, take them as directed for the full length of time directed. This includes oxygen and any inhaled medicines.   Follow up with your caregiver as directed. Waiting to do so or failure to follow up could result in worsening of  your condition and possible disability or death.   Be sure you understand what to do or who to call if your shortness of breath worsens.  SEEK MEDICAL CARE IF:   Your condition does not improve in the time expected.   You have a hard time doing your normal activities even with rest.   You have any side effects or problems with the medicines prescribed.   You develop any new symptoms.  SEEK IMMEDIATE MEDICAL CARE IF:   Your shortness of breath is getting worse.   You feel lightheaded, faint, or develop a cough not controlled with medicines.   You start coughing up blood.   You have pain with breathing.   You have chest pain or pain in your arms, shoulders, or abdomen.   You have a fever.   You are unable to walk up stairs or exercise the way you normally do.   Your symptoms are getting worse.  Document Released: 01/31/2001 Document Revised: 04/27/2011 Document Reviewed: 09/18/2007 [facility] Patient Information 2012 [place], [place].  Using Your Inhaler Proper inhaler technique is very important. Good technique assures that the medicine reaches the lungs. Poor technique results in depositing the medicine on the tongue and back of the throat rather than in the airways. STEPS TO FOLLOW IF USING INHALER WITHOUT EXTENSION TUBE: 1. Remove cap from inhaler.  2. Shake inhaler for 5 seconds before each inhalation (breathing in).  3. Position the inhaler so that the top of  the canister faces up.  4. Put your index finger on the top of the medication canister. Your thumb supports the bottom of the inhaler.  5. Open your mouth.  6. Hold the inhaler 1 to 2 inches away from your open mouth. This allows the medicine to slow down before the medicine enters the mouth.  7. Exhale (breathe out) normally and as completely as possible.  8. Press the canister down with the index finger to release the medication.  9. At the same time as the canister is pressed, inhale deeply and slowly until the  lungs are completely filled. This should take 4 to 6 seconds. Keep your tongue down and out of the way.  10. Hold the medication in your lungs for up to 10 seconds (10 seconds is best). This helps the medicine get into the small airways of your lungs to work better.  11. Breathe out slowly, through pursed lips. Whistling is an example of pursed lips.  12. Wait at least 1 minute between puffs. Continue with the above steps until you have taken the number of puffs your caregiver has ordered.  13. Replace cap on inhaler.  STEPS TO FOLLOW USING AN INHALER WITH AN EXTENSION (SPACER): 1.  Remove cap from inhaler.  2. Shake inhaler for 5 seconds before each inhalation (breathing in).  3. Your caregiver has asked you to use a spacer with your inhaler. A spacer is a plastic tube with a mouthpiece on one end and an opening that connects to the inhaler on the other end. A spacer helps you take the medicine better.  4. Place the open end of the spacer onto the mouthpiece of the inhaler.  5. Position the inhaler so that the top of the canister faces up and the spacer mouthpiece faces you.  6. Put your index finger on the top of the medication canister. Your thumb supports the bottom of the inhaler and the spacer.  7. Exhale (breathe out) normally and as completely as possible.  8. Immediately after exhaling, place the spacer between your teeth and into your mouth. Close your mouth tightly around the spacer.  9. Press the canister down with the index finger to release the medication.  10. At the same time as the canister is pressed, inhale deeply and slowly until the lungs are completely filled. This should take 4 to 6 seconds. Keep your tongue down and out of the way.  11. Hold the medication in your lungs for up to 10 seconds (10 seconds is best). This helps the medicine get into the small airways of your lungs to work better. Exhale.  12. Repeat inhaling deeply through the spacer mouthpiece. Again hold that  breath for up to 10 seconds (10 seconds is best). Exhale slowly. If it is difficult to take this second deep breath through the spacer, breathe normally several times through the spacer. Remove the spacer from your mouth.  13. Wait at least 1 minute between puffs. Continue with the above steps until you have taken the number of puffs your caregiver has ordered.  14. Remove spacer from the inhaler and place cap on inhaler.  If you are using different kinds of inhalers, use your quick relief medicine to open the airways 10 - 15 minutes before using a steroid. If you are unsure which inhalers to use and the order of using them, ask your caregiver, nurse, or respiratory therapist. If you are using a steroid inhaler, rinse your mouth with water after your  last puff and then spit out the water. Do not swallow the water. Avoid the following:  Inhaling before or after starting the spray of medicine. It takes practice to coordinate your breathing with triggering the spray.   Inhaling through the nose (rather than the mouth) when triggering the spray.  HOW TO DETERMINE IF YOUR INHALER IS FULL OR NEARLY EMPTY:  Determine when an inhaler is empty. You cannot know when an inhaler is empty by shaking it. A few inhalers are now being made with dose counters. Ask your caregiver for a prescription that has a dose counter if you feel you need that extra help.   If your inhaler does not have a counter, check the number of doses in the inhaler before you use it. The canister or box will list the number of doses in the canister. Divide the total number of doses in the canister by the number you will use each day to find how many days the canister will last. (For example, if your canister has 200 doses and you take 2 puffs, 4 times each day, which is 8 puffs a day. Dividing 200 by 8 equals 25. The canister should last 25 days.) Using a calendar, count forward that many days to see when your inhaler will run out. Write the  refill date on a calendar or your canister.   Remember, if you need to take extra doses, the inhaler will empty sooner than you figured. Be sure you have a refill before your canister runs out. Refill your inhaler 7 to 10 days before it runs out.  HOME CARE INSTRUCTIONS   Do not use the inhaler more than your caregiver tells you. If you are still wheezing and are feeling tightness in your chest, call your caregiver.   Keep an adequate supply of medication. This includes making sure the medicine is not expired, and you have a spare inhaler.   Follow your caregiver or inhaler insert directions for cleaning the inhaler and spacer.  SEEK MEDICAL CARE IF:   Symptoms are only partially relieved with your inhaler.   You are having trouble using your inhaler.   You experience some increase in phlegm.   You develop a fever of 100.5 F (38.1 C).  SEEK IMMEDIATE MEDICAL CARE IF:   You feel little or no relief with your inhalers. You are still wheezing and are feeling shortness of breath and/or tightness in your chest.   You have side effects such as dizziness, headaches or fast heart rate.   You have chills, fever, night sweats or an oral temperature above 102 F (38.9 C).   Phlegm production increases a lot, or there is blood in the phlegm.  MAKE SURE YOU:   Understand these instructions.   Will watch your condition.   Will get help right away if you are not doing well or get worse.  Document Released: 05/05/2000 Document Revised: 04/27/2011 Document Reviewed: 02/23/2009 [facility] Patient Information 2012 [place], [place].

## 2011-10-18 NOTE — Progress Notes (Signed)
  Subjective:    Terri Stevenson is a 56 y.o. female who presents for evaluation of chest pain. Onset was 1 month ago. Symptoms have worsened since that time. The patient describes the pain as heaviness and does not radiate. Patient rates pain as a 4/10 in intensity. Associated symptoms are: chest pain, dyspnea and exertional chest pressure/discomfort. Aggravating factors are: exercise and walking. Alleviating factors are: rest. Patient's cardiac risk factors are: obesity (BMI >= 30 kg/m2). Patient's risk factors for DVT/PE: none. Previous cardiac testing: none.  The following portions of the patient's history were reviewed and updated as appropriate: allergies, current medications, past family history, past medical history, past social history, past surgical history and problem list.  Review of Systems Pertinent items are noted in HPI.    Objective:    BP 120/82  Pulse 83  Temp(Src) 98 F (36.7 C) (Oral)  Resp 20  Wt 246 lb (111.585 kg)  SpO2 99%  LMP 09/22/2011 General appearance: alert, cooperative, appears stated age and no distress Nose: Nares normal. Septum midline. Mucosa normal. No drainage or sinus tenderness. Throat: lips, mucosa, and tongue normal; teeth and gums normal Neck: no adenopathy, no carotid bruit, no JVD, supple, symmetrical, trachea midline and thyroid not enlarged, symmetric, no tenderness/mass/nodules Lungs: clear to auscultation bilaterally Heart: regular rate and rhythm, S1, S2 normal, no murmur, click, rub or gallop Extremities: extremities normal, atraumatic, no cyanosis or edema  Cardiographics ECG: normal sinus rhythm, no blocks or conduction defects, no ischemic changes  Imaging Chest x-ray: not available for review    Assessment:    Chest pain, suspected etiology: exercise induced bronchospasm    Plan:    Patient history and exam consistent with non-cardiac cause of chest pain. Worsening signs and symptoms discussed and patient verbalized  understanding. check labs and cxr  pft-- severe restriction----qvqr 40 mg 2 puffs bid and proair 2 puffs 30 min before exercise ekg--normal If no relief with inhalers--refer to pulmonary

## 2011-10-19 LAB — ~~LOC~~ ALLERGY PANEL
Allergen, Comm Silver Birch, t9: <0.1 kU/L (ref <0.35)
Allergen, D pternoyssinus,d7: 0.74 kU/L — ABNORMAL HIGH (ref <0.35)
Allergen, Mulberry, t76: <0.1 kU/l (ref <0.35)
Alternaria Alternata: <0.1 kU/L (ref <0.35)
Bahia Grass: 0.34 kU/L (ref <0.35)
Cat Dander: <0.1 kU/L (ref <0.35)
Cladosporium Herbarum: <0.1 kU/L (ref <0.35)
Elm IgE: <0.1 kU/L (ref <0.35)
Johnson Grass: 0.11 kU/L (ref <0.35)
Mugwort: <0.1 kU/L (ref <0.35)
Oak: <0.1 kU/L (ref <0.35)
Penicillium Notatum: <0.1 kU/L (ref <0.35)

## 2011-10-19 LAB — ALLERGEN FOOD PROFILE SPECIFIC IGE
Apple: <0.1 kU/L (ref <0.35)
Egg White IgE: <0.1 kU/L (ref <0.35)
Fish Cod: <0.1 kU/L (ref <0.35)
IgE (Immunoglobulin E), Serum: 25.5 IU/mL (ref 0.0–180.0)
Shrimp IgE: <0.1 kU/L (ref <0.35)
Wheat IgE: <0.1 kU/L (ref <0.35)

## 2012-02-21 ENCOUNTER — Encounter: Payer: Self-pay | Admitting: Family Medicine

## 2012-02-21 ENCOUNTER — Ambulatory Visit (INDEPENDENT_AMBULATORY_CARE_PROVIDER_SITE_OTHER): Payer: Managed Care, Other (non HMO) | Admitting: Family Medicine

## 2012-02-21 VITALS — BP 112/76 | HR 71 | Temp 98.1°F | Wt 255.2 lb

## 2012-02-21 DIAGNOSIS — H6120 Impacted cerumen, unspecified ear: Secondary | ICD-10-CM

## 2012-02-21 NOTE — Progress Notes (Signed)
  Subjective:    Patient ID: Terri Stevenson, female    DOB: 1955-12-14, 56 y.o.   MRN: [id number]  HPI Pt here c/o not being able to hear out of her R ear.  No pain ---no other symptoms.    Review of Systems As above    Objective:   Physical Exam  Constitutional: She is oriented to person, place, and time. She appears well-developed and well-nourished.  HENT:       r ear--- + impacted with wax, irrigated with good results ,  TM intact --no infection  Neurological: She is alert and oriented to person, place, and time.  Psychiatric: She has a normal mood and affect. Her behavior is normal. Judgment and thought content normal.          Assessment & Plan:

## 2012-02-21 NOTE — Assessment & Plan Note (Signed)
Irrigated with good success rto prn

## 2012-07-06 ENCOUNTER — Other Ambulatory Visit: Payer: Self-pay

## 2012-09-26 ENCOUNTER — Encounter: Payer: Self-pay | Admitting: Gastroenterology

## 2013-03-27 ENCOUNTER — Other Ambulatory Visit: Payer: Self-pay

## 2013-06-06 ENCOUNTER — Ambulatory Visit (INDEPENDENT_AMBULATORY_CARE_PROVIDER_SITE_OTHER): Payer: Managed Care, Other (non HMO) | Admitting: Family Medicine

## 2013-06-06 ENCOUNTER — Encounter: Payer: Self-pay | Admitting: Family Medicine

## 2013-06-06 VITALS — BP 126/70 | HR 96 | Temp 98.5°F | Resp 16 | Wt 257.5 lb

## 2013-06-06 DIAGNOSIS — J0100 Acute maxillary sinusitis, unspecified: Secondary | ICD-10-CM | POA: Insufficient documentation

## 2013-06-06 MED ORDER — AMOXICILLIN 875 MG PO TABS: 875.0000 mg | ORAL_TABLET | Freq: Two times a day (BID) | ORAL | Status: DC

## 2013-06-06 NOTE — Progress Notes (Signed)
   Subjective:    Patient ID: Terri Stevenson, female    DOB: 1955/07/25, 58 y.o.   MRN: [id number]  HPI URI- sxs started 1/3 w/ 'viral illness'.  Thought she was getting better but as of Monday sxs have worsened.  + nasal congestion, facial pressure, ear pressure.  + PND, cough.  No recent fevers.  No tooth pain.  No N/V/D.   Review of Systems For ROS see HPI     Objective:   Physical Exam  Vitals reviewed. Constitutional: She appears well-developed and well-nourished. No distress.  HENT:  Head: Normocephalic and atraumatic.  Right Ear: Tympanic membrane normal.  Left Ear: Tympanic membrane normal.  Nose: Mucosal edema and rhinorrhea present. Right sinus exhibits maxillary sinus tenderness. Right sinus exhibits no frontal sinus tenderness. Left sinus exhibits maxillary sinus tenderness. Left sinus exhibits no frontal sinus tenderness.  Mouth/Throat: Uvula is midline and mucous membranes are normal. Posterior oropharyngeal erythema present. No oropharyngeal exudate.  Eyes: Conjunctivae and EOM are normal. Pupils are equal, round, and reactive to light.  Neck: Normal range of motion. Neck supple.  Cardiovascular: Normal rate, regular rhythm and normal heart sounds.   Pulmonary/Chest: Effort normal and breath sounds normal. No respiratory distress. She has no wheezes.  Lymphadenopathy:    She has no cervical adenopathy.          Assessment & Plan:

## 2013-06-06 NOTE — Progress Notes (Signed)
Pre visit review using our clinic review tool, if applicable. No additional management support is needed unless otherwise documented below in the visit note. 

## 2013-06-06 NOTE — Patient Instructions (Addendum)
Follow up as needed Start the Amoxicillin twice daily- take w/ food Drink plenty of fluids REST! Mucinex DM to thin your congestion and suppress cough Call with any questions or concerns Hang in there!!

## 2013-06-06 NOTE — Assessment & Plan Note (Addendum)
New.  Pt's sxs and PE consistent w/ infxn.  Start abx.  Reviewed supportive care and red flags that should prompt return.  Pt expressed understanding and is in agreement w/ plan.  

## 2014-01-06 ENCOUNTER — Encounter (HOSPITAL_BASED_OUTPATIENT_CLINIC_OR_DEPARTMENT_OTHER): Payer: Self-pay | Admitting: Emergency Medicine

## 2014-01-06 ENCOUNTER — Emergency Department (HOSPITAL_BASED_OUTPATIENT_CLINIC_OR_DEPARTMENT_OTHER): Payer: Managed Care, Other (non HMO)

## 2014-01-06 ENCOUNTER — Emergency Department (HOSPITAL_BASED_OUTPATIENT_CLINIC_OR_DEPARTMENT_OTHER)
Admission: EM | Admit: 2014-01-06 | Discharge: 2014-01-06 | Disposition: A | Discharge status: Home / Self Care | Payer: Managed Care, Other (non HMO) | Attending: Emergency Medicine | Admitting: Emergency Medicine

## 2014-01-06 DIAGNOSIS — M79609 Pain in unspecified limb: Secondary | ICD-10-CM | POA: Insufficient documentation

## 2014-01-06 DIAGNOSIS — I82819 Embolism and thrombosis of superficial veins of unspecified lower extremities: Secondary | ICD-10-CM | POA: Diagnosis not present

## 2014-01-06 DIAGNOSIS — Z792 Long term (current) use of antibiotics: Secondary | ICD-10-CM | POA: Insufficient documentation

## 2014-01-06 DIAGNOSIS — I8002 Phlebitis and thrombophlebitis of superficial vessels of left lower extremity: Secondary | ICD-10-CM

## 2014-01-06 DIAGNOSIS — Z79899 Other long term (current) drug therapy: Secondary | ICD-10-CM | POA: Diagnosis not present

## 2014-01-06 NOTE — ED Provider Notes (Signed)
CSN: [id number]     Arrival date & time 01/06/14  1859 History   First MD Initiated Contact with Patient 01/06/14 2203     Chief Complaint  Patient presents with  . Leg Pain     (Consider location/radiation/quality/duration/timing/severity/associated sxs/prior Treatment) Patient is a 58 y.o. female presenting with leg pain. The history is provided by the patient.  Leg Pain Location:  Leg Time since incident:  2 days Injury: no   Leg location:  L lower leg Pain details:    Quality:  Aching   Radiates to:  Does not radiate   Severity:  Mild   Onset quality:  Gradual   Duration:  2 days   Timing:  Constant   Progression:  Unchanged Chronicity:  New Dislocation: no   Foreign body present:  No foreign bodies Prior injury to area:  No Relieved by:  NSAIDs Worsened by:  Nothing tried Ineffective treatments:  None tried Associated symptoms: no back pain, no fatigue, no fever and no neck pain     History reviewed. No pertinent past medical history. Past Surgical History  Procedure Laterality Date  . Gastric bypass    . Cesarean section    . Shoulder surgery     Family History  Problem Relation Age of Onset  . Cancer Mother     breast  . COPD Father    History  Substance Use Topics  . Smoking status: Never Smoker   . Smokeless tobacco: Never Used  . Alcohol Use: No   OB History   Grav Para Term Preterm Abortions TAB SAB Ect Mult Living                 Review of Systems  Constitutional: Negative for fever and fatigue.  HENT: Negative for congestion and drooling.   Eyes: Negative for pain.  Respiratory: Negative for cough and shortness of breath.   Cardiovascular: Negative for chest pain.  Gastrointestinal: Negative for nausea, vomiting, abdominal pain and diarrhea.  Genitourinary: Negative for dysuria and hematuria.  Musculoskeletal: Negative for back pain, gait problem and neck pain.  Skin: Negative for color change.  Neurological: Negative for dizziness and  headaches.  Hematological: Negative for adenopathy.  Psychiatric/Behavioral: Negative for behavioral problems.  All other systems reviewed and are negative.     Allergies  Review of patient's allergies indicates no known allergies.  Home Medications   Prior to Admission medications   Medication Sig Start Date End Date Taking? Authorizing Provider  calcium carbonate (OS-CAL) 600 MG TABS Take 600 mg by mouth 2 (two) times daily with a meal.   Yes Historical Provider, MD  cholecalciferol (VITAMIN D) 1000 UNITS tablet Take 2,000 Units by mouth daily.   Yes Historical Provider, MD  MAGNESIUM CARBONATE PO Take by mouth daily.   Yes Historical Provider, MD  Multiple Vitamin (MULTIVITAMIN) tablet Take 1 tablet by mouth daily.   Yes Historical Provider, MD  Probiotic Product (ALIGN PO) Take by mouth.   Yes Historical Provider, MD  vitamin B-12 (CYANOCOBALAMIN) 100 MCG tablet Take 50 mcg by mouth daily.   Yes Historical Provider, MD  amoxicillin (AMOXIL) 875 MG tablet Take 1 tablet (875 mg total) by mouth 2 (two) times daily. 06/06/13   Midge Minium, MD   BP 151/89  Pulse 71  Temp(Src) 98.8 F (37.1 C) (Oral)  Resp 18  Ht 5\' 4"  (1.626 m)  Wt 260 lb (117.935 kg)  BMI 44.61 kg/m2  SpO2 99%  LMP 12/23/2013 Physical Exam  Nursing note and vitals reviewed. Constitutional: She is oriented to person, place, and time. She appears well-developed and well-nourished.  HENT:  Head: Normocephalic.  Mouth/Throat: No oropharyngeal exudate.  Eyes: Conjunctivae and EOM are normal. Pupils are equal, round, and reactive to light.  Neck: Normal range of motion. Neck supple.  Cardiovascular: Normal rate, regular rhythm, normal heart sounds and intact distal pulses.  Exam reveals no gallop and no friction rub.   No murmur heard. Pulmonary/Chest: Effort normal and breath sounds normal. No respiratory distress. She has no wheezes.  Abdominal: Soft. Bowel sounds are normal. There is no tenderness.  There is no rebound and no guarding.  Musculoskeletal: Normal range of motion. She exhibits no edema and no tenderness.  No obvious gross asymmetry when comparing the bilateral lower extremities.  2+ distal pulses in the bilateral lower extremities.  Faint erythema and mild tenderness to palpation of the left proximal medial calf. Small tender venous cord palpated in this area.  Neurological: She is alert and oriented to person, place, and time.  Skin: Skin is warm and dry.  Psychiatric: She has a normal mood and affect. Her behavior is normal.    ED Course  Procedures (including critical care time) Labs Review Labs Reviewed - No data to display  Imaging Review [place] Extrem Low Left Comp  01/06/2014   CLINICAL DATA:  Calf pain and swelling.  EXAM: Left LOWER EXTREMITY VENOUS DOPPLER ULTRASOUND  TECHNIQUE: Gray-scale sonography with graded compression, as well as color Doppler and duplex ultrasound were performed to evaluate the lower extremity deep venous systems from the level of the common femoral vein and including the common femoral, femoral, profunda femoral, popliteal and calf veins including the posterior tibial, peroneal and gastrocnemius veins when visible. The superficial great saphenous vein was also interrogated. Spectral Doppler was utilized to evaluate flow at rest and with distal augmentation maneuvers in the common femoral, femoral and popliteal veins.  COMPARISON:  None.  FINDINGS: Common Femoral Vein: No evidence of thrombus. Normal compressibility, respiratory phasicity and response to augmentation.  Saphenofemoral Junction: No evidence of thrombus. Normal compressibility and flow on color Doppler imaging.  Profunda Femoral Vein: No evidence of thrombus. Normal compressibility and flow on color Doppler imaging.  Femoral Vein: No evidence of thrombus. Normal compressibility, respiratory phasicity and response to augmentation.  Popliteal Vein: No evidence of thrombus. Normal  compressibility, respiratory phasicity and response to augmentation.  Calf Veins: No evidence of thrombus. Normal compressibility and flow on color Doppler imaging.  Superficial Great Saphenous Vein: There is thrombus in the greater saphenous vein in the upper calf area and distal thigh along with a thrombosed branch.  Venous Reflux:  None.  Other Findings:  None.  IMPRESSION: No findings for deep venous thrombosis.  There is a thrombosed greater saphenous vein in the upper calf area and distal thigh along with a thrombosed branch.   Electronically Signed   By: Kalman Jewels M.D.   On: 01/06/2014 22:37     EKG Interpretation None      MDM   Final diagnoses:  Saphenous vein thrombophlebitis, left    10:35 PM 58 y.o. female who presents with left calf pain. She notes that she was on a long 12 hour car ride on Saturday, 3 days ago. On Sunday night she began developing a soreness in her left proximal medial calf. She also developed some very faint erythema in this area. She denies any chest pain or shortness of breath. Her vital signs are  unremarkable here. She refuses any pain medicine. Ultrasound ordered prior to my evaluation to rule out DVT. No hx of DVT or PE.   11:21 PM: No DVT. Greater saphenous vein thrombosis seen on [place]. Cause is likely pt's recent immobilization during travel. Will treat symptomatically w/ warm compress, stockings, nsaids.  I have discussed the diagnosis/risks/treatment options with the patient and believe the pt to be eligible for discharge home to follow-up with her pcp. We also discussed returning to the ED immediately if new or worsening sx occur. We discussed the sx which are most concerning (e.g., worsening pain, sob, cp, sx of PE discussed) that necessitate immediate return. Medications administered to the patient during their visit and any new prescriptions provided to the patient are listed below.  Medications given during this visit Medications - No data to  display  New Prescriptions   No medications on file     Pamella Pert, MD 01/07/14 0025

## 2014-01-06 NOTE — ED Notes (Signed)
Pt report pain, redness, warmth, to left leg since Sunday. Pt denies cp or sob. Pt reports recent long car ride.

## 2014-01-06 NOTE — ED Notes (Signed)
Pt had 12 hour car ride Saturday.

## 2014-01-08 ENCOUNTER — Ambulatory Visit (INDEPENDENT_AMBULATORY_CARE_PROVIDER_SITE_OTHER): Payer: Managed Care, Other (non HMO) | Admitting: Family Medicine

## 2014-01-08 ENCOUNTER — Encounter: Payer: Self-pay | Admitting: Family Medicine

## 2014-01-08 VITALS — BP 118/74 | HR 66 | Temp 98.5°F | Wt 259.0 lb

## 2014-01-08 DIAGNOSIS — I82812 Embolism and thrombosis of superficial veins of left lower extremities: Secondary | ICD-10-CM | POA: Insufficient documentation

## 2014-01-08 DIAGNOSIS — I82819 Embolism and thrombosis of superficial veins of unspecified lower extremities: Secondary | ICD-10-CM

## 2014-01-08 MED ORDER — ASPIRIN EC 81 MG PO TBEC
DELAYED_RELEASE_TABLET | ORAL | Status: DC
Start: 2014-01-08 — End: 2015-10-03

## 2014-01-08 NOTE — Patient Instructions (Signed)
Phlebitis Phlebitis is soreness and swelling (inflammation) of a vein. This can occur in your arms, legs, or torso (trunk), as well as deeper inside your body. Phlebitis is usually not serious when it occurs close to the surface of the body. However, it can cause serious problems when it occurs in a vein deeper inside the body. CAUSES  Phlebitis can be triggered by various things, including:   Reduced blood flow through your veins. This can happen with:  Bed rest over a long period.  Long-distance travel.  Injury.  Surgery.  Being overweight (obese) or pregnant.  Having an IV tube put in the vein and getting certain medicines through the vein.  Cancer and cancer treatment.  Use of illegal drugs taken through the vein.  Inflammatory diseases.  Inherited (genetic) diseases that increase the risk of blood clots.  Hormone therapy, such as birth control pills. SIGNS AND SYMPTOMS   Red, tender, swollen, and painful area on your skin. Usually, the area will be long and narrow.  Firmness along the center of the affected area. This can indicate that a blood clot has formed.  Low-grade fever. DIAGNOSIS  A health care provider can usually diagnose phlebitis by examining the affected area and asking about your symptoms. To check for infection or blood clots, your health care provider may order blood tests or an ultrasound exam of the area. Blood tests and your family history may also indicate if you have an underlying genetic disease that causes blood clots. Occasionally, a piece of tissue is taken from the body (biopsy sample) if an unusual cause of phlebitis is suspected. TREATMENT  Treatment will vary depending on the severity of the condition and the area of the body affected. Treatment may include:  Use of a warm compress or heating pad.  Use of compression stockings or bandages.  Anti-inflammatory medicines.  Removal of any IV tube that may be causing the problem.  Medicines  that kill germs (antibiotics) if an infection is present.  Blood-thinning medicines if a blood clot is suspected or present.  In rare cases, surgery may be needed to remove damaged sections of vein. HOME CARE INSTRUCTIONS   Only take over-the-counter or prescription medicines as directed by your health care provider. Take all medicines exactly as prescribed.  Raise (elevate) the affected area above the level of your heart as directed by your health care provider.  Apply a warm compress or heating pad to the affected area as directed by your health care provider. Do not sleep with the heating pad.  Use compression stockings or bandages as directed. These will speed healing and prevent the condition from coming back.  If you are on blood thinners:  Get follow-up blood tests as directed by your health care provider.  Check with your health care provider before using any new medicines.  Carry a medical alert card or wear your medical alert jewelry to show that you are on blood thinners.  For phlebitis in the legs:  Avoid prolonged standing or bed rest.  Keep your legs moving. Raise your legs when sitting or lying.  Do not smoke.  Women, particularly those over the age of 35, should consider the risks and benefits of taking the contraceptive pill. This kind of hormone treatment can increase your risk for blood clots.  Follow up with your health care provider as directed. SEEK MEDICAL CARE IF:   You have unusual bruising or any bleeding problems.  Your swelling or pain in the affected area   is not improving. °· You are on anti-inflammatory medicine, and you develop belly (abdominal) pain. °SEEK IMMEDIATE MEDICAL CARE IF:  °· You have a sudden onset of chest pain or difficulty breathing. °· You have a fever or persistent symptoms for more than 2-3 days. °· You have a fever and your symptoms suddenly get worse. °MAKE SURE YOU: °· Understand these instructions. °· Will watch your  condition. °· Will get help right away if you are not doing well or get worse. °Document Released: 05/02/2001 Document Revised: 02/26/2013 Document Reviewed: 01/13/2013 °[employer]® Patient Information ©2015 [employer]. This information is not intended to replace advice given to you by your health care provider. Make sure you discuss any questions you have with your health care provider. ° °

## 2014-01-08 NOTE — Progress Notes (Signed)
Pre visit review using our clinic review tool, if applicable. No additional management support is needed unless otherwise documented below in the visit note. 

## 2014-01-08 NOTE — Progress Notes (Signed)
   Subjective:    Patient ID: Terri Stevenson, female    DOB: October 21, 1955, 58 y.o.   MRN: [id number]  HPI Pt is here for er f/u for superficial thrombosis.  See ER visit.  Pt had traveled 13 hours in the car recently when pain started.  No sob, no cp.      Review of Systems As above     Objective:   Physical Exam BP 118/74  Pulse 66  Temp(Src) 98.5 F (36.9 C) (Oral)  Wt 259 lb (117.482 kg)  SpO2 95%  LMP 12/23/2013 General appearance: alert, cooperative, appears stated age and no distress Neck: no adenopathy, supple, symmetrical, trachea midline and thyroid not enlarged, symmetric, no tenderness/mass/nodules Lungs: clear to auscultation bilaterally Heart: S1, S2 normal Extremities: L leg tenderness medial knee and behind knee       . Assessment & Plan:  1. Superficial thrombosis of left lower extremity See ER visit Elevate ext  - aspirin EC 81 MG tablet; 2 po qd

## 2014-01-14 ENCOUNTER — Ambulatory Visit: Payer: Managed Care, Other (non HMO) | Admitting: Family Medicine

## 2014-01-20 ENCOUNTER — Encounter: Payer: Self-pay | Admitting: Medical

## 2014-01-20 ENCOUNTER — Other Ambulatory Visit: Payer: Self-pay | Admitting: Obstetrics and Gynecology

## 2014-01-20 ENCOUNTER — Ambulatory Visit (HOSPITAL_BASED_OUTPATIENT_CLINIC_OR_DEPARTMENT_OTHER)
Admission: RE | Admit: 2014-01-20 | Discharge: 2014-01-20 | Disposition: A | Discharge status: Home / Self Care | Payer: Managed Care, Other (non HMO) | Source: Ambulatory Visit | Attending: Medical | Admitting: Medical

## 2014-01-20 ENCOUNTER — Ambulatory Visit (INDEPENDENT_AMBULATORY_CARE_PROVIDER_SITE_OTHER): Payer: Managed Care, Other (non HMO) | Admitting: Medical

## 2014-01-20 ENCOUNTER — Telehealth: Payer: Self-pay | Admitting: Family Medicine

## 2014-01-20 VITALS — BP 146/84 | HR 80 | Temp 98.3°F | Ht 63.75 in | Wt 264.4 lb

## 2014-01-20 DIAGNOSIS — I82819 Embolism and thrombosis of superficial veins of unspecified lower extremities: Secondary | ICD-10-CM | POA: Diagnosis not present

## 2014-01-20 DIAGNOSIS — I82812 Embolism and thrombosis of superficial veins of left lower extremities: Secondary | ICD-10-CM

## 2014-01-20 DIAGNOSIS — M25561 Pain in right knee: Secondary | ICD-10-CM

## 2014-01-20 DIAGNOSIS — M25569 Pain in unspecified knee: Secondary | ICD-10-CM

## 2014-01-20 DIAGNOSIS — Z1231 Encounter for screening mammogram for malignant neoplasm of breast: Secondary | ICD-10-CM

## 2014-01-20 NOTE — Progress Notes (Signed)
   Subjective:    Patient ID: Terri Stevenson, female    DOB: Jul 03, 1955, 58 y.o.   MRN: [id number]  HPI  Pt in with hx of known thrombosed greater saphenous vein in the upper calf area and extending into distal thigh. Pt states she has been taking asprin and she felt like she was improving. On 28th she felt better. Then on 30th she felt  Like reocurred. She feel like thrombosed vein expanded up leg. Pt states not sob. She ran a mile today. No trauma or fall.   Pt has been taking 2 regular aspirins.    Review of Systems  Constitutional: Negative for fever, chills and fatigue.  Respiratory: Negative for choking, chest tightness, shortness of breath and wheezing.        Note she states she jog 1 mile today and did not have any abnormal shortness of breath. Felt good when he jogged.  Cardiovascular: Negative for chest pain, palpitations and leg swelling.  Musculoskeletal:       Faint discomfort over medial left thigh. Only on palpation. She felt this region on the 30th.  Skin: Negative.  Negative for color change and rash.  Neurological: Negative.   Hematological: Negative for adenopathy. Does not bruise/bleed easily.  Psychiatric/Behavioral: Negative.        Objective:   Physical Exam  Constitutional: She appears well-developed and well-nourished. No distress.  HENT:  Head: Normocephalic and atraumatic.  Neck: Normal range of motion. Neck supple.  Cardiovascular: Normal rate, regular rhythm and normal heart sounds.   Pulmonary/Chest: Effort normal and breath sounds normal. No respiratory distress. She has no rales. She exhibits no tenderness.  Musculoskeletal:  Bilateral lower extremities-both calves are large but symmetric. No pitting edema. Negative Homans sign bilaterally.  Left lower extremity-in the medial distal thigh area she has a faintly palpable region it feels indurated and this might represent a vein. But there is no obvious tortuosity or blue colorization.  Skin: She  is not diaphoretic.  Psychiatric: She has a normal mood and affect. Her behavior is normal. Judgment and thought content normal.  Pleasant.             Assessment & Plan:  Note when I checked her O2 sat I got her to walk up and down the hall and her O2 sat never went below 96%. Second one up to 97% briefly. Never was below 96%.

## 2014-01-20 NOTE — Patient Instructions (Addendum)
I want you to get a left lower extremity Doppler stat at 4:30 PM. Please stay there until results are back. When I am called on the results I will talk with you directly. Currently I want you to confirm what dosage of aspirin you're using per day. If there is extension of superficial thrombosis then anticoagulant would need to be considered. Would discuss that with Dr.Lowne or hematologist. If a DVT has occurred then definitely would start anticoagulation. Followup date will be determined at on study results.

## 2014-01-20 NOTE — Telephone Encounter (Signed)
Caller name: Raynelle Highland Relation to pt: self  Call back number: [phone]   Reason for call:   Pt was seen 01/08/14 for a superficial blood clot in left calve now it has moved to upper thigh in need of clinical advice.

## 2014-01-20 NOTE — Telephone Encounter (Signed)
Pt was seen by Dr. Etter Sjogren on 01/08/14 for Left Lower Extremity Superficial Thrombosis.  Has been taking aspirin, applying heat and compression stockings, and exercising (ran a mile this morning), but today she is calling because she thinks the clot has moved to her left upper thigh.  C/o: increased warmth, redness, and swelling to middle of thigh.  States thigh is slightly painful, rated 2/10.  States "you can feel the lump" in thigh.  She denies shortness of breath and chest pain.  Pt was highly advised to be seen today.  Dr. Etter Sjogren has no appointment available today, therefore patient was placed on Mackie Pai, PA-C schedule at 11:30 am.

## 2014-01-21 ENCOUNTER — Other Ambulatory Visit (INDEPENDENT_AMBULATORY_CARE_PROVIDER_SITE_OTHER): Payer: Managed Care, Other (non HMO)

## 2014-01-21 DIAGNOSIS — I82819 Embolism and thrombosis of superficial veins of unspecified lower extremities: Secondary | ICD-10-CM

## 2014-01-21 MED ORDER — RIVAROXABAN 20 MG PO TABS: 20.0000 mg | ORAL_TABLET | Freq: Every day | ORAL | Status: DC

## 2014-01-21 NOTE — Telephone Encounter (Signed)
For pt extending/expanding superficial thrombosis I am giving her samples of xarelto 15 mg tabs #42 1 tab po bid x 21 days. Rx to pharmacy 20 mg #69 1 tab po q day. This would be 3 month total. Will talk with pt and advise her to this effect. Also will advise if she feels expanding even on xarelto then would need repeat doppler and possibly more extensive work up. Also will advise to get cbc before starting xarelto. That order will be put in chart. She can come by and get samples and do blood work.

## 2014-01-22 LAB — CBC WITH DIFFERENTIAL/PLATELET
Basophils Absolute: 0 10*3/uL (ref 0.0–0.1)
Basophils Relative: 0.1 % (ref 0.0–3.0)
Eosinophils Absolute: 0.2 10*3/uL (ref 0.0–0.7)
Eosinophils Relative: 3.3 % (ref 0.0–5.0)
HCT: 40.2 % (ref 36.0–46.0)
HEMOGLOBIN: 13.1 g/dL (ref 12.0–15.0)
Lymphocytes Relative: 21.1 % (ref 12.0–46.0)
Lymphs Abs: 1.6 10*3/uL (ref 0.7–4.0)
MCHC: 32.7 g/dL (ref 30.0–36.0)
MCV: 89.7 fl (ref 78.0–100.0)
MONOS PCT: 4.1 % (ref 3.0–12.0)
Monocytes Absolute: 0.3 10*3/uL (ref 0.1–1.0)
NEUTROS ABS: 5.3 10*3/uL (ref 1.4–7.7)
Neutrophils Relative %: 71.4 % (ref 43.0–77.0)
Platelets: 210 10*3/uL (ref 150.0–400.0)
RBC: 4.48 Mil/uL (ref 3.87–5.11)
RDW: 14.8 % (ref 11.5–15.5)
WBC: 7.5 10*3/uL (ref 4.0–10.5)

## 2014-02-03 ENCOUNTER — Encounter: Payer: Self-pay | Admitting: Gastroenterology

## 2014-02-20 ENCOUNTER — Ambulatory Visit (HOSPITAL_BASED_OUTPATIENT_CLINIC_OR_DEPARTMENT_OTHER)
Admission: RE | Admit: 2014-02-20 | Discharge: 2014-02-20 | Disposition: A | Discharge status: Home / Self Care | Payer: Managed Care, Other (non HMO) | Source: Ambulatory Visit | Attending: Obstetrics and Gynecology | Admitting: Obstetrics and Gynecology

## 2014-02-20 DIAGNOSIS — Z1231 Encounter for screening mammogram for malignant neoplasm of breast: Secondary | ICD-10-CM | POA: Diagnosis present

## 2014-03-24 ENCOUNTER — Other Ambulatory Visit (HOSPITAL_COMMUNITY)
Admission: RE | Admit: 2014-03-24 | Discharge: 2014-03-24 | Disposition: A | Discharge status: Home / Self Care | Payer: Managed Care, Other (non HMO) | Source: Ambulatory Visit | Attending: Family Medicine | Admitting: Family Medicine

## 2014-03-24 ENCOUNTER — Encounter: Payer: Self-pay | Admitting: Family Medicine

## 2014-03-24 ENCOUNTER — Ambulatory Visit (INDEPENDENT_AMBULATORY_CARE_PROVIDER_SITE_OTHER): Payer: Managed Care, Other (non HMO) | Admitting: Family Medicine

## 2014-03-24 DIAGNOSIS — I809 Phlebitis and thrombophlebitis of unspecified site: Secondary | ICD-10-CM

## 2014-03-24 DIAGNOSIS — Z6834 Body mass index (BMI) 34.0-34.9, adult: Secondary | ICD-10-CM | POA: Insufficient documentation

## 2014-03-24 DIAGNOSIS — Z1151 Encounter for screening for human papillomavirus (HPV): Secondary | ICD-10-CM | POA: Diagnosis present

## 2014-03-24 DIAGNOSIS — Z124 Encounter for screening for malignant neoplasm of cervix: Secondary | ICD-10-CM

## 2014-03-24 DIAGNOSIS — E785 Hyperlipidemia, unspecified: Secondary | ICD-10-CM

## 2014-03-24 DIAGNOSIS — Z0000 Encounter for general adult medical examination without abnormal findings: Secondary | ICD-10-CM

## 2014-03-24 DIAGNOSIS — Z01419 Encounter for gynecological examination (general) (routine) without abnormal findings: Secondary | ICD-10-CM | POA: Diagnosis present

## 2014-03-24 LAB — BASIC METABOLIC PANEL
BUN: 12 mg/dL (ref 6–23)
CALCIUM: 8.9 mg/dL (ref 8.4–10.5)
CO2: 25 mEq/L (ref 19–32)
CREATININE: 0.9 mg/dL (ref 0.4–1.2)
Chloride: 102 mEq/L (ref 96–112)
GFR: 68.55 mL/min (ref >60.00)
GLUCOSE: 91 mg/dL (ref 70–99)
Potassium: 4 mEq/L (ref 3.5–5.1)
Sodium: 139 mEq/L (ref 135–145)

## 2014-03-24 LAB — CBC WITH DIFFERENTIAL/PLATELET
BASOS ABS: 0 10*3/uL (ref 0.0–0.1)
Basophils Relative: 0.5 % (ref 0.0–3.0)
EOS ABS: 0.1 10*3/uL (ref 0.0–0.7)
Eosinophils Relative: 1.5 % (ref 0.0–5.0)
HCT: 42.6 % (ref 36.0–46.0)
HEMOGLOBIN: 14.1 g/dL (ref 12.0–15.0)
LYMPHS PCT: 25 % (ref 12.0–46.0)
Lymphs Abs: 1.8 10*3/uL (ref 0.7–4.0)
MCHC: 33 g/dL (ref 30.0–36.0)
MCV: 87.5 fl (ref 78.0–100.0)
Monocytes Absolute: 0.3 10*3/uL (ref 0.1–1.0)
Monocytes Relative: 3.9 % (ref 3.0–12.0)
NEUTROS ABS: 5.1 10*3/uL (ref 1.4–7.7)
NEUTROS PCT: 69.1 % (ref 43.0–77.0)
PLATELETS: 250 10*3/uL (ref 150.0–400.0)
RBC: 4.86 Mil/uL (ref 3.87–5.11)
RDW: 14.9 % (ref 11.5–15.5)
WBC: 7.4 10*3/uL (ref 4.0–10.5)

## 2014-03-24 LAB — POCT URINALYSIS DIPSTICK
BILIRUBIN UA: NEGATIVE
Blood, UA: NEGATIVE
Glucose, UA: NEGATIVE
KETONES UA: NEGATIVE
Leukocytes, UA: NEGATIVE
Nitrite, UA: NEGATIVE
PH UA: 5
Spec Grav, UA: >=1.03
Urobilinogen, UA: 0.2

## 2014-03-24 LAB — LIPID PANEL
Cholesterol: 218 mg/dL — ABNORMAL HIGH (ref 0–200)
HDL: 69.5 mg/dL (ref >39.00)
LDL CALC: 129 mg/dL — AB (ref 0–99)
NonHDL: 148.5
Total CHOL/HDL Ratio: 3
Triglycerides: 96 mg/dL (ref 0.0–149.0)
VLDL: 19.2 mg/dL (ref 0.0–40.0)

## 2014-03-24 LAB — HEPATIC FUNCTION PANEL
ALBUMIN: 3.4 g/dL — AB (ref 3.5–5.2)
ALK PHOS: 78 U/L (ref 39–117)
ALT: 12 U/L (ref 0–35)
AST: 16 U/L (ref 0–37)
BILIRUBIN TOTAL: 0.6 mg/dL (ref 0.2–1.2)
Bilirubin, Direct: 0 mg/dL (ref 0.0–0.3)
Total Protein: 7.4 g/dL (ref 6.0–8.3)

## 2014-03-24 LAB — VITAMIN B12: Vitamin B-12: 335 pg/mL (ref 211–911)

## 2014-03-24 LAB — TSH: TSH: 2.51 u[IU]/mL (ref 0.35–4.50)

## 2014-03-24 MED ORDER — NALTREXONE-BUPROPION HCL ER 8-90 MG PO TB12: ORAL_TABLET | ORAL | Status: DC

## 2014-03-24 MED ORDER — RIVAROXABAN 20 MG PO TABS: 20.0000 mg | ORAL_TABLET | Freq: Every day | ORAL | Status: DC

## 2014-03-24 NOTE — Progress Notes (Signed)
Subjective:     Terri Stevenson is a 58 y.o. female and is here for a comprehensive physical exam. The patient reports no problems.  History   Social History  . Marital Status: Married    Spouse Name: N/A    Number of Children: N/A  . Years of Education: N/A   Occupational History  . Not on file.   Social History Main Topics  . Smoking status: Never Smoker   . Smokeless tobacco: Never Used  . Alcohol Use: No  . Drug Use: No  . Sexual Activity: Not on file   Other Topics Concern  . Not on file   Social History Narrative   Health Maintenance  Topic Date Due  . PAP SMEAR  12/03/2012  . INFLUENZA VACCINE  03/25/2015 (Originally 12/20/2013)  . MAMMOGRAM  02/21/2016  . TETANUS/TDAP  12/03/2017  . COLONOSCOPY  12/08/2017    The following portions of the patient's history were reviewed and updated as appropriate:  She  has no past medical history on file. She  does not have any pertinent problems on file. She  has past surgical history that includes Gastric bypass; Cesarean section; and Shoulder surgery. Her family history includes COPD in her father; Cancer in her mother. She  reports that she has never smoked. She has never used smokeless tobacco. She reports that she does not drink alcohol or use illicit drugs. She has a current medication list which includes the following prescription(s): aspirin ec, calcium carbonate, cholecalciferol, magnesium carbonate, multivitamin, probiotic product, vitamin b-12, naltrexone-bupropion hcl er, and rivaroxaban. Current Outpatient Prescriptions on File Prior to Visit  Medication Sig Dispense Refill  . aspirin EC 81 MG tablet 2 po qd    . calcium carbonate (OS-CAL) 600 MG TABS Take 600 mg by mouth 2 (two) times daily with a meal.    . cholecalciferol (VITAMIN D) 1000 UNITS tablet Take 2,000 Units by mouth daily.    Marland Kitchen MAGNESIUM CARBONATE PO Take by mouth daily.    . Multiple Vitamin (MULTIVITAMIN) tablet Take 1 tablet by mouth daily.     . Probiotic Product (ALIGN PO) Take by mouth.    . vitamin B-12 (CYANOCOBALAMIN) 100 MCG tablet Take 50 mcg by mouth daily.     No current facility-administered medications on file prior to visit.   She has No Known Allergies..  Review of Systems Review of Systems  Constitutional: Negative for activity change, appetite change and fatigue.  HENT: Negative for hearing loss, congestion, tinnitus and ear discharge.  dentist q34m Eyes: Negative for visual disturbance (see optho q1y -- vision corrected to 20/20 with glasses).  Respiratory: Negative for cough, chest tightness and shortness of breath.   Cardiovascular: Negative for chest pain, palpitations and leg swelling.  Gastrointestinal: Negative for abdominal pain, diarrhea, constipation and abdominal distention.  Genitourinary: Negative for urgency, frequency, decreased urine volume and difficulty urinating.  Musculoskeletal: Negative for back pain, arthralgias and gait problem.  Skin: Negative for color change, pallor and rash.  Neurological: Negative for dizziness, light-headedness, numbness and headaches.  Hematological: Negative for adenopathy. Does not bruise/bleed easily.  Psychiatric/Behavioral: Negative for suicidal ideas, confusion, sleep disturbance, self-injury, dysphoric mood, decreased concentration and agitation.       Objective:    BP 92/61 mmHg  Pulse 88  Temp(Src) 98.4 F (36.9 C) (Oral)  Ht 5\' 5"  (1.651 m)  Wt 265 lb (120.203 kg)  BMI 44.10 kg/m2  SpO2 97%  LMP 01/16/2014 General appearance: alert, cooperative, appears stated age and  no distress Head: Normocephalic, without obvious abnormality, atraumatic Eyes: negative findings: lids and lashes normal and pupils equal, round, reactive to light and accomodation Ears: normal TM's and external ear canals both ears Nose: Nares normal. Septum midline. Mucosa normal. No drainage or sinus tenderness. Throat: lips, mucosa, and tongue normal; teeth and gums  normal Neck: no adenopathy, no carotid bruit, no JVD, supple, symmetrical, trachea midline and thyroid not enlarged, symmetric, no tenderness/mass/nodules Back: symmetric, no curvature. ROM normal. No CVA tenderness. Lungs: clear to auscultation bilaterally Breasts: normal appearance, no masses or tenderness Heart: regular rate and rhythm, S1, S2 normal, no murmur, click, rub or gallop Abdomen: soft, non-tender; bowel sounds normal; no masses,  no organomegaly Pelvic: cervix normal in appearance, external genitalia normal, no adnexal masses or tenderness, no cervical motion tenderness, rectovaginal septum normal, uterus normal size, shape, and consistency, vagina normal without discharge and pap done Extremities: extremities normal, atraumatic, no cyanosis or edema Pulses: 2+ and symmetric Skin: Skin color, texture, turgor normal. No rashes or lesions Lymph nodes: Cervical, supraclavicular, and axillary nodes normal. Neurologic: Alert and oriented X 3, normal strength and tone. Normal symmetric reflexes. Normal coordination and gait Psych-- no depression, no anxiety      Assessment:    Healthy female exam.      Plan:    ghm utd Check labs See After Visit Summary for Counseling Recommendations   1. Morbid obesity con't exercise-- pt will work on diet - Naltrexone-Bupropion HCl ER 8-90 MG TB12; 2 po bid  Dispense: 120 tablet; Refill: 2 - Basic metabolic panel - CBC with Differential - Hepatic function panel - Lipid panel - POCT urinalysis dipstick - TSH - Vitamin B12 - Vitamin D 1,25 dihydroxy  2. Superficial thrombophlebitis Pt never finished xarelto or started back on aspirin----restart xarelto 15 mg bid for 21 days then 20 mg qd rto 3 months We will recheck [place] at that time - rivaroxaban (XARELTO) 20 MG TABS tablet; Take 1 tablet (20 mg total) by mouth daily with supper.  Dispense: 69 tablet; Refill: 0  3. Preventative health care   - Basic metabolic panel - CBC with  Differential - POCT urinalysis dipstick - TSH - Vitamin B12 - Vitamin D 1,25 dihydroxy  4. Hyperlipidemia Check labs - Hepatic function panel - Lipid panel  5. Severe obesity (BMI >= 40)

## 2014-03-24 NOTE — Progress Notes (Signed)
Pre visit review using our clinic review tool, if applicable. No additional management support is needed unless otherwise documented below in the visit note. 

## 2014-03-24 NOTE — Patient Instructions (Signed)
Preventive Care for Adults A healthy lifestyle and preventive care can promote health and wellness. Preventive health guidelines for women include the following key practices.  A routine yearly physical is a good way to check with your health care provider about your health and preventive screening. It is a chance to share any concerns and updates on your health and to receive a thorough exam.  Visit your dentist for a routine exam and preventive care every 6 months. Brush your teeth twice a day and floss once a day. Good oral hygiene prevents tooth decay and gum disease.  The frequency of eye exams is based on your age, health, family medical history, use of contact lenses, and other factors. Follow your health care provider's recommendations for frequency of eye exams.  Eat a healthy diet. Foods like vegetables, fruits, whole grains, low-fat dairy products, and lean protein foods contain the nutrients you need without too many calories. Decrease your intake of foods high in solid fats, added sugars, and salt. Eat the right amount of calories for you.Get information about a proper diet from your health care provider, if necessary.  Regular physical exercise is one of the most important things you can do for your health. Most adults should get at least 150 minutes of moderate-intensity exercise (any activity that increases your heart rate and causes you to sweat) each week. In addition, most adults need muscle-strengthening exercises on 2 or more days a week.  Maintain a healthy weight. The body mass index (BMI) is a screening tool to identify possible weight problems. It provides an estimate of body fat based on height and weight. Your health care provider can find your BMI and can help you achieve or maintain a healthy weight.For adults 20 years and older:  A BMI below 18.5 is considered underweight.  A BMI of 18.5 to 24.9 is normal.  A BMI of 25 to 29.9 is considered overweight.  A BMI of  30 and above is considered obese.  Maintain normal blood lipids and cholesterol levels by exercising and minimizing your intake of saturated fat. Eat a balanced diet with plenty of fruit and vegetables. Blood tests for lipids and cholesterol should begin at age 76 and be repeated every 5 years. If your lipid or cholesterol levels are high, you are over 50, or you are at high risk for heart disease, you may need your cholesterol levels checked more frequently.Ongoing high lipid and cholesterol levels should be treated with medicines if diet and exercise are not working.  If you smoke, find out from your health care provider how to quit. If you do not use tobacco, do not start.  Lung cancer screening is recommended for adults aged 22-80 years who are at high risk for developing lung cancer because of a history of smoking. A yearly low-dose CT scan of the lungs is recommended for people who have at least a 30-pack-year history of smoking and are a current smoker or have quit within the past 15 years. A pack year of smoking is smoking an average of 1 pack of cigarettes a day for 1 year (for example: 1 pack a day for 30 years or 2 packs a day for 15 years). Yearly screening should continue until the smoker has stopped smoking for at least 15 years. Yearly screening should be stopped for people who develop a health problem that would prevent them from having lung cancer treatment.  If you are pregnant, do not drink alcohol. If you are breastfeeding,  be very cautious about drinking alcohol. If you are not pregnant and choose to drink alcohol, do not have more than 1 drink per day. One drink is considered to be 12 ounces (355 mL) of beer, 5 ounces (148 mL) of wine, or 1.5 ounces (44 mL) of liquor.  Avoid use of street drugs. Do not share needles with anyone. Ask for help if you need support or instructions about stopping the use of drugs.  High blood pressure causes heart disease and increases the risk of  stroke. Your blood pressure should be checked at least every 1 to 2 years. Ongoing high blood pressure should be treated with medicines if weight loss and exercise do not work.  If you are 75-52 years old, ask your health care provider if you should take aspirin to prevent strokes.  Diabetes screening involves taking a blood sample to check your fasting blood sugar level. This should be done once every 3 years, after age 15, if you are within normal weight and without risk factors for diabetes. Testing should be considered at a younger age or be carried out more frequently if you are overweight and have at least 1 risk factor for diabetes.  Breast cancer screening is essential preventive care for women. You should practice "breast self-awareness." This means understanding the normal appearance and feel of your breasts and may include breast self-examination. Any changes detected, no matter how small, should be reported to a health care provider. Women in their 58s and 30s should have a clinical breast exam (CBE) by a health care provider as part of a regular health exam every 1 to 3 years. After age 16, women should have a CBE every year. Starting at age 53, women should consider having a mammogram (breast X-ray test) every year. Women who have a family history of breast cancer should talk to their health care provider about genetic screening. Women at a high risk of breast cancer should talk to their health care providers about having an MRI and a mammogram every year.  Breast cancer gene (BRCA)-related cancer risk assessment is recommended for women who have family members with BRCA-related cancers. BRCA-related cancers include breast, ovarian, tubal, and peritoneal cancers. Having family members with these cancers may be associated with an increased risk for harmful changes (mutations) in the breast cancer genes BRCA1 and BRCA2. Results of the assessment will determine the need for genetic counseling and  BRCA1 and BRCA2 testing.  Routine pelvic exams to screen for cancer are no longer recommended for nonpregnant women who are considered low risk for cancer of the pelvic organs (ovaries, uterus, and vagina) and who do not have symptoms. Ask your health care provider if a screening pelvic exam is right for you.  If you have had past treatment for cervical cancer or a condition that could lead to cancer, you need Pap tests and screening for cancer for at least 20 years after your treatment. If Pap tests have been discontinued, your risk factors (such as having a new sexual partner) need to be reassessed to determine if screening should be resumed. Some women have medical problems that increase the chance of getting cervical cancer. In these cases, your health care provider may recommend more frequent screening and Pap tests.  The HPV test is an additional test that may be used for cervical cancer screening. The HPV test looks for the virus that can cause the cell changes on the cervix. The cells collected during the Pap test can be  tested for HPV. The HPV test could be used to screen women aged 30 years and older, and should be used in women of any age who have unclear Pap test results. After the age of 30, women should have HPV testing at the same frequency as a Pap test.  Colorectal cancer can be detected and often prevented. Most routine colorectal cancer screening begins at the age of 50 years and continues through age 75 years. However, your health care provider may recommend screening at an earlier age if you have risk factors for colon cancer. On a yearly basis, your health care provider may provide home test kits to check for hidden blood in the stool. Use of a small camera at the end of a tube, to directly examine the colon (sigmoidoscopy or colonoscopy), can detect the earliest forms of colorectal cancer. Talk to your health care provider about this at age 50, when routine screening begins. Direct  exam of the colon should be repeated every 5-10 years through age 75 years, unless early forms of pre-cancerous polyps or small growths are found.  People who are at an increased risk for hepatitis B should be screened for this virus. You are considered at high risk for hepatitis B if:  You were born in a country where hepatitis B occurs often. Talk with your health care provider about which countries are considered high risk.  Your parents were born in a high-risk country and you have not received a shot to protect against hepatitis B (hepatitis B vaccine).  You have HIV or AIDS.  You use needles to inject street drugs.  You live with, or have sex with, someone who has hepatitis B.  You get hemodialysis treatment.  You take certain medicines for conditions like cancer, organ transplantation, and autoimmune conditions.  Hepatitis C blood testing is recommended for all people born from 1945 through 1965 and any individual with known risks for hepatitis C.  Practice safe sex. Use condoms and avoid high-risk sexual practices to reduce the spread of sexually transmitted infections (STIs). STIs include gonorrhea, chlamydia, syphilis, trichomonas, herpes, HPV, and human immunodeficiency virus (HIV). Herpes, HIV, and HPV are viral illnesses that have no cure. They can result in disability, cancer, and death.  You should be screened for sexually transmitted illnesses (STIs) including gonorrhea and chlamydia if:  You are sexually active and are younger than 24 years.  You are older than 24 years and your health care provider tells you that you are at risk for this type of infection.  Your sexual activity has changed since you were last screened and you are at an increased risk for chlamydia or gonorrhea. Ask your health care provider if you are at risk.  If you are at risk of being infected with HIV, it is recommended that you take a prescription medicine daily to prevent HIV infection. This is  called preexposure prophylaxis (PrEP). You are considered at risk if:  You are a heterosexual woman, are sexually active, and are at increased risk for HIV infection.  You take drugs by injection.  You are sexually active with a partner who has HIV.  Talk with your health care provider about whether you are at high risk of being infected with HIV. If you choose to begin PrEP, you should first be tested for HIV. You should then be tested every 3 months for as long as you are taking PrEP.  Osteoporosis is a disease in which the bones lose minerals and strength   with aging. This can result in serious bone fractures or breaks. The risk of osteoporosis can be identified using a bone density scan. Women ages 65 years and over and women at risk for fractures or osteoporosis should discuss screening with their health care providers. Ask your health care provider whether you should take a calcium supplement or vitamin D to reduce the rate of osteoporosis.  Menopause can be associated with physical symptoms and risks. Hormone replacement therapy is available to decrease symptoms and risks. You should talk to your health care provider about whether hormone replacement therapy is right for you.  Use sunscreen. Apply sunscreen liberally and repeatedly throughout the day. You should seek shade when your shadow is shorter than you. Protect yourself by wearing long sleeves, pants, a wide-brimmed hat, and sunglasses year round, whenever you are outdoors.  Once a month, do a whole body skin exam, using a mirror to look at the skin on your back. Tell your health care provider of new moles, moles that have irregular borders, moles that are larger than a pencil eraser, or moles that have changed in shape or color.  Stay current with required vaccines (immunizations).  Influenza vaccine. All adults should be immunized every year.  Tetanus, diphtheria, and acellular pertussis (Td, Tdap) vaccine. Pregnant women should  receive 1 dose of Tdap vaccine during each pregnancy. The dose should be obtained regardless of the length of time since the last dose. Immunization is preferred during the 27th-36th week of gestation. An adult who has not previously received Tdap or who does not know her vaccine status should receive 1 dose of Tdap. This initial dose should be followed by tetanus and diphtheria toxoids (Td) booster doses every 10 years. Adults with an unknown or incomplete history of completing a 3-dose immunization series with Td-containing vaccines should begin or complete a primary immunization series including a Tdap dose. Adults should receive a Td booster every 10 years.  Varicella vaccine. An adult without evidence of immunity to varicella should receive 2 doses or a second dose if she has previously received 1 dose. Pregnant females who do not have evidence of immunity should receive the first dose after pregnancy. This first dose should be obtained before leaving the health care facility. The second dose should be obtained 4-8 weeks after the first dose.  Human papillomavirus (HPV) vaccine. Females aged 13-26 years who have not received the vaccine previously should obtain the 3-dose series. The vaccine is not recommended for use in pregnant females. However, pregnancy testing is not needed before receiving a dose. If a female is found to be pregnant after receiving a dose, no treatment is needed. In that case, the remaining doses should be delayed until after the pregnancy. Immunization is recommended for any person with an immunocompromised condition through the age of 26 years if she did not get any or all doses earlier. During the 3-dose series, the second dose should be obtained 4-8 weeks after the first dose. The third dose should be obtained 24 weeks after the first dose and 16 weeks after the second dose.  Zoster vaccine. One dose is recommended for adults aged 60 years or older unless certain conditions are  present.  Measles, mumps, and rubella (MMR) vaccine. Adults born before 1957 generally are considered immune to measles and mumps. Adults born in 1957 or later should have 1 or more doses of MMR vaccine unless there is a contraindication to the vaccine or there is laboratory evidence of immunity to   each of the three diseases. A routine second dose of MMR vaccine should be obtained at least 28 days after the first dose for students attending postsecondary schools, health care workers, or international travelers. People who received inactivated measles vaccine or an unknown type of measles vaccine during 1963-1967 should receive 2 doses of MMR vaccine. People who received inactivated mumps vaccine or an unknown type of mumps vaccine before 1979 and are at high risk for mumps infection should consider immunization with 2 doses of MMR vaccine. For females of childbearing age, rubella immunity should be determined. If there is no evidence of immunity, females who are not pregnant should be vaccinated. If there is no evidence of immunity, females who are pregnant should delay immunization until after pregnancy. Unvaccinated health care workers born before 1957 who lack laboratory evidence of measles, mumps, or rubella immunity or laboratory confirmation of disease should consider measles and mumps immunization with 2 doses of MMR vaccine or rubella immunization with 1 dose of MMR vaccine.  Pneumococcal 13-valent conjugate (PCV13) vaccine. When indicated, a person who is uncertain of her immunization history and has no record of immunization should receive the PCV13 vaccine. An adult aged 19 years or older who has certain medical conditions and has not been previously immunized should receive 1 dose of PCV13 vaccine. This PCV13 should be followed with a dose of pneumococcal polysaccharide (PPSV23) vaccine. The PPSV23 vaccine dose should be obtained at least 8 weeks after the dose of PCV13 vaccine. An adult aged 19  years or older who has certain medical conditions and previously received 1 or more doses of PPSV23 vaccine should receive 1 dose of PCV13. The PCV13 vaccine dose should be obtained 1 or more years after the last PPSV23 vaccine dose.  Pneumococcal polysaccharide (PPSV23) vaccine. When PCV13 is also indicated, PCV13 should be obtained first. All adults aged 65 years and older should be immunized. An adult younger than age 65 years who has certain medical conditions should be immunized. Any person who resides in a nursing home or long-term care facility should be immunized. An adult smoker should be immunized. People with an immunocompromised condition and certain other conditions should receive both PCV13 and PPSV23 vaccines. People with human immunodeficiency virus (HIV) infection should be immunized as soon as possible after diagnosis. Immunization during chemotherapy or radiation therapy should be avoided. Routine use of PPSV23 vaccine is not recommended for American Indians, [place] Natives, or people younger than 65 years unless there are medical conditions that require PPSV23 vaccine. When indicated, people who have unknown immunization and have no record of immunization should receive PPSV23 vaccine. One-time revaccination 5 years after the first dose of PPSV23 is recommended for people aged 19-64 years who have chronic kidney failure, nephrotic syndrome, asplenia, or immunocompromised conditions. People who received 1-2 doses of PPSV23 before age 65 years should receive another dose of PPSV23 vaccine at age 65 years or later if at least 5 years have passed since the previous dose. Doses of PPSV23 are not needed for people immunized with PPSV23 at or after age 65 years.  Meningococcal vaccine. Adults with asplenia or persistent complement component deficiencies should receive 2 doses of quadrivalent meningococcal conjugate (MenACWY-D) vaccine. The doses should be obtained at least 2 months apart.  Microbiologists working with certain meningococcal bacteria, military recruits, people at risk during an outbreak, and people who travel to or live in countries with a high rate of meningitis should be immunized. A first-year college student up through age   21 years who is living in a residence hall should receive a dose if she did not receive a dose on or after her 16th birthday. Adults who have certain high-risk conditions should receive one or more doses of vaccine.  Hepatitis A vaccine. Adults who wish to be protected from this disease, have certain high-risk conditions, work with hepatitis A-infected animals, work in hepatitis A research labs, or travel to or work in countries with a high rate of hepatitis A should be immunized. Adults who were previously unvaccinated and who anticipate close contact with an international adoptee during the first 60 days after arrival in the [place] from a country with a high rate of hepatitis A should be immunized.  Hepatitis B vaccine. Adults who wish to be protected from this disease, have certain high-risk conditions, may be exposed to blood or other infectious body fluids, are household contacts or sex partners of hepatitis B positive people, are clients or workers in certain care facilities, or travel to or work in countries with a high rate of hepatitis B should be immunized.  Haemophilus influenzae type b (Hib) vaccine. A previously unvaccinated person with asplenia or sickle cell disease or having a scheduled splenectomy should receive 1 dose of Hib vaccine. Regardless of previous immunization, a recipient of a hematopoietic stem cell transplant should receive a 3-dose series 6-12 months after her successful transplant. Hib vaccine is not recommended for adults with HIV infection. Preventive Services / Frequency Ages 64 to 68 years  Blood pressure check.** / Every 1 to 2 years.  Lipid and cholesterol check.** / Every 5 years beginning at age  22.  Clinical breast exam.** / Every 3 years for women in their 88s and 53s.  BRCA-related cancer risk assessment.** / For women who have family members with a BRCA-related cancer (breast, ovarian, tubal, or peritoneal cancers).  Pap test.** / Every 2 years from ages 90 through 51. Every 3 years starting at age 21 through age 56 or 3 with a history of 3 consecutive normal Pap tests.  HPV screening.** / Every 3 years from ages 24 through ages 1 to 46 with a history of 3 consecutive normal Pap tests.  Hepatitis C blood test.** / For any individual with known risks for hepatitis C.  Skin self-exam. / Monthly.  Influenza vaccine. / Every year.  Tetanus, diphtheria, and acellular pertussis (Tdap, Td) vaccine.** / Consult your health care provider. Pregnant women should receive 1 dose of Tdap vaccine during each pregnancy. 1 dose of Td every 10 years.  Varicella vaccine.** / Consult your health care provider. Pregnant females who do not have evidence of immunity should receive the first dose after pregnancy.  HPV vaccine. / 3 doses over 6 months, if 72 and younger. The vaccine is not recommended for use in pregnant females. However, pregnancy testing is not needed before receiving a dose.  Measles, mumps, rubella (MMR) vaccine.** / You need at least 1 dose of MMR if you were born in 1957 or later. You may also need a 2nd dose. For females of childbearing age, rubella immunity should be determined. If there is no evidence of immunity, females who are not pregnant should be vaccinated. If there is no evidence of immunity, females who are pregnant should delay immunization until after pregnancy.  Pneumococcal 13-valent conjugate (PCV13) vaccine.** / Consult your health care provider.  Pneumococcal polysaccharide (PPSV23) vaccine.** / 1 to 2 doses if you smoke cigarettes or if you have certain conditions.  Meningococcal vaccine.** /  1 dose if you are age 19 to 21 years and a first-year college  student living in a residence hall, or have one of several medical conditions, you need to get vaccinated against meningococcal disease. You may also need additional booster doses.  Hepatitis A vaccine.** / Consult your health care provider.  Hepatitis B vaccine.** / Consult your health care provider.  Haemophilus influenzae type b (Hib) vaccine.** / Consult your health care provider. Ages 40 to 64 years  Blood pressure check.** / Every 1 to 2 years.  Lipid and cholesterol check.** / Every 5 years beginning at age 20 years.  Lung cancer screening. / Every year if you are aged 55-80 years and have a 30-pack-year history of smoking and currently smoke or have quit within the past 15 years. Yearly screening is stopped once you have quit smoking for at least 15 years or develop a health problem that would prevent you from having lung cancer treatment.  Clinical breast exam.** / Every year after age 40 years.  BRCA-related cancer risk assessment.** / For women who have family members with a BRCA-related cancer (breast, ovarian, tubal, or peritoneal cancers).  Mammogram.** / Every year beginning at age 40 years and continuing for as long as you are in good health. Consult with your health care provider.  Pap test.** / Every 3 years starting at age 30 years through age 65 or 70 years with a history of 3 consecutive normal Pap tests.  HPV screening.** / Every 3 years from ages 30 years through ages 65 to 70 years with a history of 3 consecutive normal Pap tests.  Fecal occult blood test (FOBT) of stool. / Every year beginning at age 50 years and continuing until age 75 years. You may not need to do this test if you get a colonoscopy every 10 years.  Flexible sigmoidoscopy or colonoscopy.** / Every 5 years for a flexible sigmoidoscopy or every 10 years for a colonoscopy beginning at age 50 years and continuing until age 75 years.  Hepatitis C blood test.** / For all people born from 1945 through  1965 and any individual with known risks for hepatitis C.  Skin self-exam. / Monthly.  Influenza vaccine. / Every year.  Tetanus, diphtheria, and acellular pertussis (Tdap/Td) vaccine.** / Consult your health care provider. Pregnant women should receive 1 dose of Tdap vaccine during each pregnancy. 1 dose of Td every 10 years.  Varicella vaccine.** / Consult your health care provider. Pregnant females who do not have evidence of immunity should receive the first dose after pregnancy.  Zoster vaccine.** / 1 dose for adults aged 60 years or older.  Measles, mumps, rubella (MMR) vaccine.** / You need at least 1 dose of MMR if you were born in 1957 or later. You may also need a 2nd dose. For females of childbearing age, rubella immunity should be determined. If there is no evidence of immunity, females who are not pregnant should be vaccinated. If there is no evidence of immunity, females who are pregnant should delay immunization until after pregnancy.  Pneumococcal 13-valent conjugate (PCV13) vaccine.** / Consult your health care provider.  Pneumococcal polysaccharide (PPSV23) vaccine.** / 1 to 2 doses if you smoke cigarettes or if you have certain conditions.  Meningococcal vaccine.** / Consult your health care provider.  Hepatitis A vaccine.** / Consult your health care provider.  Hepatitis B vaccine.** / Consult your health care provider.  Haemophilus influenzae type b (Hib) vaccine.** / Consult your health care provider. Ages 65   years and over  Blood pressure check.** / Every 1 to 2 years.  Lipid and cholesterol check.** / Every 5 years beginning at age 22 years.  Lung cancer screening. / Every year if you are aged 73-80 years and have a 30-pack-year history of smoking and currently smoke or have quit within the past 15 years. Yearly screening is stopped once you have quit smoking for at least 15 years or develop a health problem that would prevent you from having lung cancer  treatment.  Clinical breast exam.** / Every year after age 4 years.  BRCA-related cancer risk assessment.** / For women who have family members with a BRCA-related cancer (breast, ovarian, tubal, or peritoneal cancers).  Mammogram.** / Every year beginning at age 40 years and continuing for as long as you are in good health. Consult with your health care provider.  Pap test.** / Every 3 years starting at age 9 years through age 34 or 91 years with 3 consecutive normal Pap tests. Testing can be stopped between 65 and 70 years with 3 consecutive normal Pap tests and no abnormal Pap or HPV tests in the past 10 years.  HPV screening.** / Every 3 years from ages 57 years through ages 64 or 45 years with a history of 3 consecutive normal Pap tests. Testing can be stopped between 65 and 70 years with 3 consecutive normal Pap tests and no abnormal Pap or HPV tests in the past 10 years.  Fecal occult blood test (FOBT) of stool. / Every year beginning at age 15 years and continuing until age 17 years. You may not need to do this test if you get a colonoscopy every 10 years.  Flexible sigmoidoscopy or colonoscopy.** / Every 5 years for a flexible sigmoidoscopy or every 10 years for a colonoscopy beginning at age 86 years and continuing until age 71 years.  Hepatitis C blood test.** / For all people born from [birth year] through 1964 and any individual with known risks for hepatitis C.  Osteoporosis screening.** / A one-time screening for women ages 83 years and over and women at risk for fractures or osteoporosis.  Skin self-exam. / Monthly.  Influenza vaccine. / Every year.  Tetanus, diphtheria, and acellular pertussis (Tdap/Td) vaccine.** / 1 dose of Td every 10 years.  Varicella vaccine.** / Consult your health care provider.  Zoster vaccine.** / 1 dose for adults aged 61 years or older.  Pneumococcal 13-valent conjugate (PCV13) vaccine.** / Consult your health care provider.  Pneumococcal  polysaccharide (PPSV23) vaccine.** / 1 dose for all adults aged 28 years and older.  Meningococcal vaccine.** / Consult your health care provider.  Hepatitis A vaccine.** / Consult your health care provider.  Hepatitis B vaccine.** / Consult your health care provider.  Haemophilus influenzae type b (Hib) vaccine.** / Consult your health care provider. ** Family history and personal history of risk and conditions may change your health care provider's recommendations. Document Released: 07/04/2001 Document Revised: 09/22/2013 Document Reviewed: 10/03/2010 [facility] Patient Information 2015 [place], [place]. This information is not intended to replace advice given to you by your health care provider. Make sure you discuss any questions you have with your health care provider.

## 2014-03-24 NOTE — Addendum Note (Signed)
Addended by: Ewing Schlein on: 03/24/2014 10:27 AM   Modules accepted: Orders

## 2014-03-25 LAB — CYTOLOGY - PAP

## 2014-03-27 LAB — VITAMIN D 1,25 DIHYDROXY
VITAMIN D 1, 25 (OH) TOTAL: 65 pg/mL (ref 18–72)
VITAMIN D3 1, 25 (OH): 65 pg/mL
Vitamin D2 1, 25 (OH)2: <8 pg/mL

## 2014-04-08 ENCOUNTER — Ambulatory Visit (INDEPENDENT_AMBULATORY_CARE_PROVIDER_SITE_OTHER): Payer: Managed Care, Other (non HMO) | Admitting: *Deleted

## 2014-04-08 DIAGNOSIS — E538 Deficiency of other specified B group vitamins: Secondary | ICD-10-CM

## 2014-04-08 MED ORDER — CYANOCOBALAMIN 1000 MCG/ML IJ SOLN
1000.0000 ug | Freq: Once | INTRAMUSCULAR | Status: AC
  Administered 2014-04-08: 1000 ug via INTRAMUSCULAR

## 2014-04-15 ENCOUNTER — Ambulatory Visit (INDEPENDENT_AMBULATORY_CARE_PROVIDER_SITE_OTHER): Payer: Managed Care, Other (non HMO)

## 2014-04-15 DIAGNOSIS — E538 Deficiency of other specified B group vitamins: Secondary | ICD-10-CM

## 2014-04-15 MED ORDER — CYANOCOBALAMIN 1000 MCG/ML IJ SOLN
1000.0000 ug | Freq: Once | INTRAMUSCULAR | Status: AC
  Administered 2014-04-15: 1000 ug via INTRAMUSCULAR

## 2014-04-15 NOTE — Progress Notes (Signed)
Pre visit review using our clinic review tool, if applicable. No additional management support is needed unless otherwise documented below in the visit note.  Patient tolerated injection well.  

## 2014-04-22 ENCOUNTER — Ambulatory Visit (INDEPENDENT_AMBULATORY_CARE_PROVIDER_SITE_OTHER): Payer: Managed Care, Other (non HMO)

## 2014-04-22 DIAGNOSIS — E538 Deficiency of other specified B group vitamins: Secondary | ICD-10-CM

## 2014-04-22 MED ORDER — CYANOCOBALAMIN 1000 MCG/ML IJ SOLN
1000.0000 ug | Freq: Once | INTRAMUSCULAR | Status: AC
  Administered 2014-04-22: 1000 ug via INTRAMUSCULAR

## 2014-04-22 NOTE — Progress Notes (Signed)
Pre visit review using our clinic review tool, if applicable. No additional management support is needed unless otherwise documented below in the visit note. 

## 2014-04-22 NOTE — Progress Notes (Signed)
Pt tolerated injection well

## 2014-04-29 ENCOUNTER — Ambulatory Visit (INDEPENDENT_AMBULATORY_CARE_PROVIDER_SITE_OTHER): Payer: Managed Care, Other (non HMO)

## 2014-04-29 DIAGNOSIS — E538 Deficiency of other specified B group vitamins: Secondary | ICD-10-CM

## 2014-04-29 DIAGNOSIS — D531 Other megaloblastic anemias, not elsewhere classified: Secondary | ICD-10-CM

## 2014-04-29 MED ORDER — CYANOCOBALAMIN 1000 MCG/ML IJ SOLN
1000.0000 ug | Freq: Once | INTRAMUSCULAR | Status: AC
  Administered 2014-04-29: 1000 ug via INTRAMUSCULAR

## 2014-04-29 NOTE — Progress Notes (Signed)
Pre visit review using our clinic review tool, if applicable. No additional management support is needed unless otherwise documented below in the visit note.  Patient tolerated injection well.  

## 2014-05-06 ENCOUNTER — Other Ambulatory Visit (INDEPENDENT_AMBULATORY_CARE_PROVIDER_SITE_OTHER): Payer: Managed Care, Other (non HMO)

## 2014-05-06 DIAGNOSIS — E538 Deficiency of other specified B group vitamins: Secondary | ICD-10-CM

## 2014-05-06 LAB — VITAMIN B12: VITAMIN B 12: 756 pg/mL (ref 211–911)

## 2014-05-12 ENCOUNTER — Other Ambulatory Visit: Payer: Managed Care, Other (non HMO)

## 2014-05-19 ENCOUNTER — Encounter: Payer: Self-pay | Admitting: Family Medicine

## 2014-05-19 ENCOUNTER — Other Ambulatory Visit: Payer: Self-pay

## 2014-05-19 DIAGNOSIS — I82812 Embolism and thrombosis of superficial veins of left lower extremities: Secondary | ICD-10-CM

## 2014-05-19 NOTE — Telephone Encounter (Signed)
Ok to stop xaralto--  Aspirin 81 mg 2 a day Recheck doppler of low ext

## 2014-05-20 ENCOUNTER — Other Ambulatory Visit (HOSPITAL_COMMUNITY): Payer: Self-pay | Admitting: Family Medicine

## 2014-05-20 DIAGNOSIS — M25569 Pain in unspecified knee: Secondary | ICD-10-CM

## 2014-05-20 DIAGNOSIS — M7989 Other specified soft tissue disorders: Secondary | ICD-10-CM

## 2014-05-21 ENCOUNTER — Telehealth: Payer: Self-pay

## 2014-05-21 ENCOUNTER — Ambulatory Visit (HOSPITAL_COMMUNITY)
Admission: RE | Admit: 2014-05-21 | Discharge: 2014-05-21 | Disposition: A | Discharge status: Home / Self Care | Payer: Managed Care, Other (non HMO) | Source: Ambulatory Visit | Attending: Family Medicine | Admitting: Family Medicine

## 2014-05-21 DIAGNOSIS — M25569 Pain in unspecified knee: Secondary | ICD-10-CM

## 2014-05-21 DIAGNOSIS — Z86718 Personal history of other venous thrombosis and embolism: Secondary | ICD-10-CM | POA: Diagnosis not present

## 2014-05-21 DIAGNOSIS — I82812 Embolism and thrombosis of superficial veins of left lower extremities: Secondary | ICD-10-CM | POA: Diagnosis not present

## 2014-05-21 DIAGNOSIS — M7989 Other specified soft tissue disorders: Secondary | ICD-10-CM

## 2014-05-21 DIAGNOSIS — Z09 Encounter for follow-up examination after completed treatment for conditions other than malignant neoplasm: Secondary | ICD-10-CM | POA: Diagnosis present

## 2014-05-21 DIAGNOSIS — Z7901 Long term (current) use of anticoagulants: Secondary | ICD-10-CM | POA: Insufficient documentation

## 2014-05-21 NOTE — Telephone Encounter (Signed)
No clot--- still take aspirin 81 mg 2 a day preventatively

## 2014-05-21 NOTE — Telephone Encounter (Signed)
Pt notified and made aware.  Pt agreed to follow plan.

## 2014-05-21 NOTE — Telephone Encounter (Signed)
Terri Stevenson from Vascular Lab at [facility] called to report the following:  Vascular Duplex of Left Leg:    No acute superficial vein thrombosis.  Chronic wall thickening from distal thigh to proximal calf. Short segment at knee does not compress.  Pt is pain free at this area.

## 2014-05-21 NOTE — Progress Notes (Signed)
VASCULAR LAB PRELIMINARY  PRELIMINARY  PRELIMINARY  PRELIMINARY  Left lower extremity venous duplex completed.    Preliminary report:  Left leg is negative for deep vein thrombosis.  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  Eleasha Cataldo, RVT 05/21/2014, 1:21 PM

## 2014-06-09 ENCOUNTER — Telehealth: Payer: Self-pay | Admitting: *Deleted

## 2014-06-09 NOTE — Telephone Encounter (Signed)
Prior authorization initiated for Contrave. Awaiting determination. JG//CMA

## 2014-06-10 NOTE — Telephone Encounter (Signed)
Approved.  

## 2014-06-15 NOTE — Telephone Encounter (Signed)
PA approved effective 06/10/2014 through 06/11/2015 by Holland Falling. JG//CMA

## 2014-08-24 ENCOUNTER — Encounter: Payer: Self-pay | Admitting: Family Medicine

## 2014-08-24 ENCOUNTER — Ambulatory Visit (INDEPENDENT_AMBULATORY_CARE_PROVIDER_SITE_OTHER): Payer: Managed Care, Other (non HMO) | Admitting: Family Medicine

## 2014-08-24 VITALS — BP 113/77 | HR 72 | Temp 98.5°F | Wt 248.0 lb

## 2014-08-24 DIAGNOSIS — R059 Cough, unspecified: Secondary | ICD-10-CM

## 2014-08-24 DIAGNOSIS — R05 Cough: Secondary | ICD-10-CM | POA: Diagnosis not present

## 2014-08-24 DIAGNOSIS — J0111 Acute recurrent frontal sinusitis: Secondary | ICD-10-CM | POA: Diagnosis not present

## 2014-08-24 MED ORDER — NALTREXONE-BUPROPION HCL ER 8-90 MG PO TB12: ORAL_TABLET | ORAL | Status: DC

## 2014-08-24 MED ORDER — FLUTICASONE PROPIONATE 50 MCG/ACT NA SUSP: 2.0000 | Freq: Every day | NASAL | Status: DC

## 2014-08-24 MED ORDER — AMOXICILLIN-POT CLAVULANATE 875-125 MG PO TABS: 1.0000 | ORAL_TABLET | Freq: Two times a day (BID) | ORAL | Status: DC

## 2014-08-24 MED ORDER — PREDNISONE 10 MG PO TABS: ORAL_TABLET | ORAL | Status: DC

## 2014-08-24 NOTE — Progress Notes (Signed)
Pre visit review using our clinic review tool, if applicable. No additional management support is needed unless otherwise documented below in the visit note. 

## 2014-08-24 NOTE — Patient Instructions (Signed)

## 2014-08-24 NOTE — Progress Notes (Signed)
  Subjective:     Terri Stevenson is a 59 y.o. female who presents for evaluation of symptoms of a URI. Symptoms include bilateral ear pressure/pain, congestion, facial pain, nasal congestion, no  fever, non productive cough, post nasal drip and sinus pressure. Onset of symptoms was 2 weeks ago, and has been gradually worsening since that time. Treatment to date: antihistamines and cough suppressants.  The following portions of the patient's history were reviewed and updated as appropriate: allergies, current medications, past family history, past medical history, past social history, past surgical history and problem list.  Review of Systems Pertinent items are noted in HPI.   Objective:    BP 113/77 mmHg  Pulse 72  Temp(Src) 98.5 F (36.9 C) (Oral)  Wt 248 lb (112.492 kg)  SpO2 97% General appearance: alert, cooperative, appears stated age and no distress Ears: +TM + fluid b/u Nose: yellow discharge, moderate congestion, turbinates red, swollen, sinus tenderness bilateral Throat: abnormal findings: mild oropharyngeal erythema and pnd Neck: mild anterior cervical adenopathy, supple, symmetrical, trachea midline and thyroid not enlarged, symmetric, no tenderness/mass/nodules Lungs: clear to auscultation bilaterally Heart: S1, S2 normal Extremities: extremities normal, atraumatic, no cyanosis or edema Lymph nodes: Cervical adenopathy: b/l   Assessment:    sinusitis   Plan:    Discussed the diagnosis and treatment of sinusitis. Suggested symptomatic OTC remedies. Nasal saline spray for congestion. Augmentin per orders. Nasal steroids per orders. Follow up as needed.

## 2014-09-14 ENCOUNTER — Encounter: Payer: Self-pay | Admitting: *Deleted

## 2014-10-07 ENCOUNTER — Encounter: Payer: Self-pay | Admitting: Internal Medicine

## 2014-10-29 ENCOUNTER — Ambulatory Visit (INDEPENDENT_AMBULATORY_CARE_PROVIDER_SITE_OTHER): Payer: Managed Care, Other (non HMO) | Admitting: Family Medicine

## 2014-10-29 VITALS — Ht 64.0 in | Wt 228.5 lb

## 2014-10-29 DIAGNOSIS — E669 Obesity, unspecified: Secondary | ICD-10-CM | POA: Diagnosis not present

## 2014-10-29 NOTE — Progress Notes (Signed)
Patient ID: Terri Stevenson, female   DOB: Nov 15, 1955, 59 y.o.   MRN: [id number] Nutrition Clinic Visit  Pt seen in nutrition clinic with Dr. Jenne Campus. Pt presents to discuss nutrition in light of her restrictive eating.  Goal for coming to nutrition clinic: Get over fear of eating and learn how to eat normal, thinks she is too restrictive.  Eating pattern: [phone] calories per day, 100-200 calories every 3 hours  Foods/beverages frequently in diet:  -ground chicken, pumpernickle and ezikiel bread, strawberries, blueberries, white fish, scallops, veggies  Physical activity: 3x/week 1 hour each time, strength, conditioning, and cardio  24-hr recall: (Up at 6:30 AM) B (8 AM)-   1 piece Ezikiel bread, 1 tsp peanut butter Snk -    none L (11:30 PM)-   1 piece pumper nickle bread [place] farm, 2 oz     ham Snk -    none D (4:30 PM)-   1/4 cup riceeronie, grilled chicken, steamed      cabbage, Ritz baked crackers, cabbage Snk -    none Typical day? No.  Normal day:  Breakfast 8 am 1 slice pumpernickle bread, 2 oz cooked chicken, 2 tomato slices, water Snack 10:17 nutrisystem shake 160 calories Lunch 1 pm 1 cup tomatoes and cucmbers, [place] dressing, dried tune Snack possibly in afternoon: 16 animal crackers Dinner 4:30 1 piece pumpernickle bread, 1 oz ham Often has berries for snacks.  Sleeps 7-7.5 hours a night. Typically has protein shake for dinner.  Had nausea and headache yesterday and did not feel like eating much. Started contrave in February. Has been taking consistently. Has not had much of an appetite since starting this medication. Lost 42 lbs since February.  History gastric bypass in 2008. Lost 3 lbs a month for 2 years. Seen by Dr Hassell Done for her bypass. Saw bariatric dietician. Felt this was not helpful.  Previously walking and jogging, though had cortisone shot last week that helped significantly with her knee discomfort.    Recommendations:  1. Add protein to most  meals and post exercise - preferably whole real food 2. Increase vegetables preferably at each meal 3. HAALT - hungry, angry, anxious, lonely, tired - are red flags for making poor decisions 4. Using your lists of Feelings and Needs, ask yourself these three questions, and write your responses:  1. What am I feeling right now? 2. What do I want to feel? 3. What do I truly need right now?  After each question, ask yourself, "Is there anything more?" You are looking for feelings, not thoughts.   Bring your responses to your follow-up appt.     Follow up 11/12/14 at 11 am.  Tommi Rumps, MD Family Medicine Resident PGY-3   60 minutes of direct face-to-face time was spent with the patient with at least 50% of this time spent counseling the patient.

## 2014-10-29 NOTE — Patient Instructions (Signed)
1. Add protein to most meals and post exercise - preferably whole real food - protein and carbs within 30 minutes of finishing your work out 2. Increase vegetables preferably at each meal 3. HAALT - hungry, angry, anxious, lonely, tired - are red flags for making poor decisions 4. Using your lists of Feelings and Needs, ask yourself these three questions, and write your responses:  1. What am I feeling right now? 2. What do I want to feel? 3. What do I truly need right now?  After each question, ask yourself, "Is there anything more?" You are looking for feelings, not thoughts.   Bring your responses to your follow-up appt.

## 2014-11-12 ENCOUNTER — Ambulatory Visit (INDEPENDENT_AMBULATORY_CARE_PROVIDER_SITE_OTHER): Payer: Managed Care, Other (non HMO) | Admitting: Family Medicine

## 2014-11-12 VITALS — Ht 64.0 in | Wt 227.9 lb

## 2014-11-12 DIAGNOSIS — E669 Obesity, unspecified: Secondary | ICD-10-CM

## 2014-11-12 NOTE — Progress Notes (Signed)
Patient ID: Terri Stevenson, female   DOB: 18-Oct-1955, 59 y.o.   MRN: [id number]  Nutrition Clinic Visit  Pt seen in nutrition clinic with Dr. Jenne Campus. Pt presents to discuss nutrition in light of her restrictive eating.  How have things gone since the last visit? Has upped protein and veggies. Added a cup of spinach in smoothie each day. 1000 calories each day. Is feeling better and able to do more at the gym than previously. Had one day last week where she felt poorly while working out and did not have enough to eat. Did journal some. Had trouble finding stuff that is good to eat and if couldn't find anything good she will not eat anything. Has fear and anxiety around this and will walk away from the food.   What goals do you feel you have done well on? Added 21 g protein shake daily. Added protein to every snack with peanut butter, humus. Added spinach and has been doing tomatoes and squash.   Feels anxious, tired, obsessed, and fear of eating. Also felt justified if had not eaten much prior to snacking. Frustrated as well. Anxious is the most overwhelming feeling.   Wants to feel in control. Did not use the list, though now looking through the list wants to feel courageous, balanced, free, mellow, calm, content, and freedom of thinking about food. Wants to feel connected to something.  Truly needs to remove the fear of eating. Needs the ability to make good choices. Needs relaxation as well. Though patient states she is unsure of exactly what she needs. Needs energy and fun in her life.   What goals have been especially challenging? Lists of feelings and needs.   Physical activity: goes to gym 3 days a week, does the elliptical on the other days for 24 minutes.  Has goal to lose 24 more lbs by labor day.   24-hr recall: (Up at 6:30 AM) B (7:30 AM)-   Protein shake 21 g protein, 1 c spinach, 1/2 c cantaloupe  Snk  -   None L (11:30 PM)-   1 tbsp PB and 1 tbsp jelly, pumpernickle bread Snk  (1 PM)-   1 c Tomatoes and cucumbers, 2 oz tuna salad, tsp mayo Snk (4 pm)  1 c cherry's D (7 PM)-   2 oz grilled chicken, corn on cob 1/2 c,  2 wedges roasted potatoes Snk (9 PM)-   4 oz 2% milk Typical day? Yes.    Recommendations:  1. Increase daily calorie intake by having a serving of protein, vegetables, and carbs with each meal 2. Continue to eat protein with each meal by adding meat or eggs with each meal 3. Eat prior to each work out 4. Work on a meals list of 7-10 meals that you enjoy that includes some amount of carbs, protein and vegetable to be able to eat 3 full meals a day. Email this list to Dr Jenne Campus.  5. Pick up a copy of "Eating in the Light of the Moon" and start reading this book  Follow up 12/17/14 10 am.  Tommi Rumps, MD Family Medicine Resident PGY-3   60 minutes of direct face-to-face time was spent with the patient with at least 50% of this time spent counseling the patient.

## 2014-11-12 NOTE — Patient Instructions (Signed)
1. Increase daily calorie intake by having a serving of protein, vegetables, and carbs with each meal - goal of 1500 calories 2. Continue to eat protein with each meal by adding meat or eggs with each meal 3. Eat prior to each work out 4. Work on a meals list of 7-10 meals that you enjoy that includes some amount of carbs, protein and vegetable to be able to eat 3 full meals a day. Email this list to Dr Jenne Campus. [email or web address] 5. Pick up a copy of "Eating in the Light of the [place]" and start reading this book  Using your lists of Feelings and Needs, ask yourself these three questions, and write your responses:  1. What am I feeling right now? 2. What do I want to feel? 3. What do I truly need right now?  After each question, ask yourself, "Is there anything more?" You are looking for feelings, not thoughts.  Bring your responses to your follow-up appt.

## 2014-12-17 ENCOUNTER — Ambulatory Visit (INDEPENDENT_AMBULATORY_CARE_PROVIDER_SITE_OTHER): Payer: Managed Care, Other (non HMO) | Admitting: Family Medicine

## 2014-12-17 VITALS — Ht 64.0 in | Wt 222.2 lb

## 2014-12-17 DIAGNOSIS — E669 Obesity, unspecified: Secondary | ICD-10-CM

## 2014-12-17 NOTE — Assessment & Plan Note (Signed)
Recommendations: 1. Get on regular scheduled eating pattern with standard meals 3 times daily 2.  Think about what time you first felt hungry 3.  Satisfaction in what you eat 4.  Sit down 3 times daily to eat your meals 5.  Start using 3 questions anytime that you find yourself struggling: How do I feel right now? How do I want to feel? What do I really need right now? - use feelings list and write answers down

## 2014-12-17 NOTE — Progress Notes (Signed)
   Nutrition Clinic Visit   Terri Stevenson is a 59 y.o. female with a history of restrictive eating s/p gastric bypass here for nutrition clinic with Dr. Jenne Campus.  Has been reading "In the light of the moon" and reports that it is a great book.  Has been taking notes while reading back through it.  The book is full of hope but it is hard work to open up to the questions it poses.  The biggest question that this raises is where to start.  How have things been going since last visit? Consciously eating more calories (1000 to 1100 calories on a regular basis), but worried because the scale doesn't seem to move.  So tempted to go backward because the scale isn't moving.  History of weight loss: Started at 311, had gastric bypass on 01/14/1997, got weight down to 198, exercising a lot, injuries and gained 10 and 15lbs, then back up to 267 within the last year, felt very out of control and started contrave from PCP, started losing 9-10lbs per month but only eating 400-500 calories daily, difficult to keep up with thinking ad working out, so got worried about over-restriction.  24-hr recall:  (Up at 6:30 AM) B (730 AM)- banana, 1 tbsp peanut butter, 1/4 cup blueberries, 1 cup cheerios with 1% milk (2oz)  Lunch (1130 AM)- ground chicken breast (7.0JJ), 2 slices pumpernickel bread, tomato (at 3/4 of meal)  Snk (230 PM)- Snack bar Snack  (6 PM)-  3/4 of Hamburger from [employer] (8:30 PM)-  1/2 of 8" meatball sub Typical day? No. because "I  Ate crap" - usually eat more vegetables and less fast food. Calorie intake was pretty typical though  Only felt hungry at 6pm (thats why she had the hamburger).  Feels like she was avoiding going to work by adding cheerios to breakfast. Using having protein with each meal.  Goal was to be down 70lbs by labor day and it is becoming clear that it is not possible.  When eating hamburger yesterday - felt foolish, disappointed, unsatisfied, surprised (that I  realized it tasted so bad, at awareness), frustrated; wanted to feel satisfied, pleasure/enjoyment, refreshed; needed "change for a $20", food (hesitant to admit to this), massage/pain relief at that time  Recommendations: 1. Get on regular scheduled eating pattern with standard meals 3 times daily 2.  Think about what time you first felt hungry 3.  Satisfaction in what you eat 4.  Sit down 3 times daily to eat your meals 5.  Start using 3 questions anytime that you find yourself struggling: How do I feel right now? How do I want to feel? What do I really need right now? - use feelings list and write answers down   Virginia Crews, MD, MPH PGY-2,  [facility] 12/17/2014  10:03 AM

## 2014-12-17 NOTE — Patient Instructions (Addendum)
Nice to meet you today.  Watch this video: [email or web address]  Recommendations: 1. Get on regular scheduled eating pattern with standard meals 3 times daily 2.  Think about what time you first felt hungry 3.  Satisfaction in what you eat 4.  Sit down 3 times daily to eat your meals 5.  Start using 3 questions anytime that you find yourself struggling: How do I feel right now? How do I want to feel? What do I really need right now? - use feelings list and write answers down  Schedule next nutrition clinic appointment on 9/15 at 9:30 am.

## 2014-12-21 NOTE — Addendum Note (Signed)
Addended by: Talbert Cage L on: 12/21/2014 11:20 AM   Modules accepted: Level of Service

## 2014-12-21 NOTE — Addendum Note (Signed)
Addended by: Talbert Cage L on: 12/21/2014 11:21 AM   Modules accepted: Level of Service

## 2014-12-29 ENCOUNTER — Other Ambulatory Visit: Payer: Self-pay | Admitting: *Deleted

## 2014-12-29 DIAGNOSIS — I83893 Varicose veins of bilateral lower extremities with other complications: Secondary | ICD-10-CM

## 2015-01-01 ENCOUNTER — Telehealth: Payer: Self-pay

## 2015-01-01 ENCOUNTER — Telehealth: Payer: Self-pay | Admitting: Family Medicine

## 2015-01-01 NOTE — Telephone Encounter (Signed)
Patient Name: Terri Stevenson  DOB: March 09, 1956    Initial Comment Caller states she has a headache, when she bend over she feels dizzy, left side chest pain.   Nurse Assessment  Nurse: Raphael Gibney, RN, [name] Date/Time [name] Time): 01/01/2015 1:10:46 PM  Confirm and document reason for call. If symptomatic, describe symptoms. ---Caller states she has a headache. When she bends over, she feels dizzy. left chest feels "weird". No SOB. Left chest has discomfort. Symptoms started yesterday.  Has the patient traveled out of the country within the last 30 days? ---Not Applicable  Does the patient require triage? ---Yes  Related visit to physician within the last 2 weeks? ---No  Does the PT have any chronic conditions? (i.e. diabetes, asthma, etc.) ---No     Guidelines    Guideline Title Affirmed Question Affirmed Notes  Chest Pain Dizziness or lightheadedness    Final Disposition User   Go to ED Now Raphael Gibney, RN, [place] office and spoke to [name] and gave her report that pt is having left chest discomfort with dizziness and has triage outcome of go to ER now but does not want to go to the ER but wants to come into the office. States she will let Dr. Etter Sjogren know. Advised caller that someone will call her back from the office.   Referrals  GO TO FACILITY REFUSED   Disagree/Comply: Disagree  Disagree/Comply Reason: Disagree with instructions

## 2015-01-01 NOTE — Telephone Encounter (Signed)
Please call patient and ask some more questions--- if she is actively having chest pain I really dont want her to wait until Monday

## 2015-01-01 NOTE — Telephone Encounter (Signed)
Call from the Triage nurse who stated the patient has called them c/o chest pain on the left side and dizziness but did not want to go to the ED per instructions/protocol. She said she wanted to see her doctor. The patient has an apt for Monday and the Triage Nurse wanted to make you aware.       KP

## 2015-01-01 NOTE — Telephone Encounter (Signed)
Pt has an appointment scheduled with Dr. Etter Sjogren on 01/04/15 @ 9 am.

## 2015-01-01 NOTE — Telephone Encounter (Signed)
Msg left to call the office     KP 

## 2015-01-04 ENCOUNTER — Ambulatory Visit: Payer: Managed Care, Other (non HMO) | Admitting: Family Medicine

## 2015-01-04 ENCOUNTER — Telehealth: Payer: Self-pay | Admitting: Family Medicine

## 2015-01-04 NOTE — Telephone Encounter (Signed)
Patient canceled her apt for today and MD has been made aware.     KP

## 2015-01-04 NOTE — Telephone Encounter (Signed)
yes

## 2015-01-04 NOTE — Telephone Encounter (Signed)
Patient left voicemail canceling her 9am appointment for today stating she feels better- charge or no charge

## 2015-02-04 ENCOUNTER — Ambulatory Visit (INDEPENDENT_AMBULATORY_CARE_PROVIDER_SITE_OTHER): Payer: Managed Care, Other (non HMO) | Admitting: Family Medicine

## 2015-02-04 VITALS — Ht 64.0 in | Wt 216.9 lb

## 2015-02-04 DIAGNOSIS — E669 Obesity, unspecified: Secondary | ICD-10-CM

## 2015-02-04 NOTE — Patient Instructions (Addendum)
1. Monitor how she is feeling when she is eating.  2. Recognize those symptoms of hypoglycemia.  3. May beneficial to get a snack at 8 or 9 pm  4. Add small amount of yogurt (mix some plain with some sweet) with some berries in the AM.  Follow up on November 10 at 9 AM.   If they are not able to schedule this at the front desk then please call Dr. Jenne Campus to schedule the appointment.

## 2015-02-04 NOTE — Progress Notes (Signed)
Progress towards goals: has started   She was eating 1100-1200 calories and gained 8 lbs. She was distressed so went back the contrave and started eating 800 calories. She feels better since doing this. Denies any lightheadedness. Has more energy at the gym and can go longer. She is not counting calories anymore as this caused her too much distress.  She is not eating out anymore. She is eating the same foods everyday but looks forward to eating them.  Eating three meals a day with 2 snacks.   She is on a regular diet and it is normal for her and she feels good about it. She was out in [place] and had eaten coleslaw and lemon drop martini and had vomiting afterwards. She thinks that it was related to some form of coleslaw. She was at food market and picked up a lemonade with 7 g of sugar and vomited after this.   She has continued reading the book, "Eating in the light of the [facility]," but has not discussed it with Manuela Schwartz. Her sister has read it and has been discussing it with her. Her sister is 311 lbs. [name] had a baby reveal party while her sister was here and her sister was not please with her giving out most of the food from the party.  Her sister didn't think she had much food at her house so her sister went to the grocery store to buy chips, soda, chocolate, and fruit loops. It was hard having her sister at her house as her sister brought in foods that are tempting to her.   She has tried vegetables in the past. She has only tried certain vegetables one time and then stopped them.  24-hr recall:  (Up at 6:30 AM):  B (7 AM)- . 2 oz of ham, fresh tomato, 1 slice of pumpernickel , mustard    Snk (8 AM)-   cup of coffee (1tspn) milk and sugar, 2 tbspn cottage cheese, granola, strawberries, blackberries, blueberries  L ( PM)-  Chicken 2 oz ground, onion, ketchup, mustard and regular mayo,1 slice pumpernickel   Snk ( PM)-  2 sugar free cookies with cup of tea,  D (7:30 PM)-  Beef cups 1.5 oz,  pepper, onion, mushrooms Snk ( PM)-   Typical day? Yes.    Recent physical activity includes: Working out three times per week for about an hour and walks everyday for 10-15 minute walk in the morning and 45 minute walk in the afternoon. Wakes up at midnight and feels hungry but she doesn't get out of bed to eat. This happens 2-4 times per week.    Has started kayaking for the past month at [place]. They do this on the weekends. If she is out then she will bring banana or protein bar. .   Handouts given during visit include:  AVS  Barriers to learning/adherence to lifestyle change: previous weight gain from expanding the amount of calories to her diet.   Demonstrated degree of understanding via:  Teach Back   Monitoring/Evaluation:  Dietary intake, exercise. 1. Monitor how she is feeling when she is eating.  2. Recognize those symptoms of hypoglycemia.  3. May beneficial to get a snack at 8 or 9 pm  4. Add small amount of yogurt (mix some plain with some sweet) with some berries in the AM.   Greater than 25 minutes was spent face to face with more than 50% of the time counseling.   Rosemarie Ax,  MD PGY-3, [facility]

## 2015-02-10 ENCOUNTER — Encounter: Payer: Self-pay | Admitting: Surgery

## 2015-02-15 ENCOUNTER — Encounter: Payer: Self-pay | Admitting: Surgery

## 2015-02-15 ENCOUNTER — Other Ambulatory Visit: Payer: Self-pay | Admitting: Surgery

## 2015-02-15 ENCOUNTER — Ambulatory Visit (HOSPITAL_COMMUNITY)
Admission: RE | Admit: 2015-02-15 | Discharge: 2015-02-15 | Disposition: A | Discharge status: Home / Self Care | Payer: Managed Care, Other (non HMO) | Source: Ambulatory Visit | Attending: Surgery | Admitting: Surgery

## 2015-02-15 ENCOUNTER — Ambulatory Visit (INDEPENDENT_AMBULATORY_CARE_PROVIDER_SITE_OTHER): Payer: Managed Care, Other (non HMO) | Admitting: Surgery

## 2015-02-15 VITALS — BP 132/69 | HR 67 | Ht 64.0 in | Wt 216.0 lb

## 2015-02-15 DIAGNOSIS — I83893 Varicose veins of bilateral lower extremities with other complications: Secondary | ICD-10-CM

## 2015-02-15 DIAGNOSIS — I872 Venous insufficiency (chronic) (peripheral): Secondary | ICD-10-CM | POA: Diagnosis not present

## 2015-02-15 DIAGNOSIS — Z8679 Personal history of other diseases of the circulatory system: Secondary | ICD-10-CM

## 2015-02-15 DIAGNOSIS — Z8672 Personal history of thrombophlebitis: Secondary | ICD-10-CM | POA: Diagnosis not present

## 2015-02-15 NOTE — Progress Notes (Signed)
Reason for referral: Swollen bilateral leg  History of Present Illness  Terri Stevenson is a 59 y.o. female who presents with chief complaint: swollen leg.  Patient notes, onset of swelling 2 years  ago, associated with pain on the anterior shin and lateral left leg.  The patient has had a history of DVT, a history of varicose vein, no history of venous stasis ulcers, no history of  Lymphedema and no history of skin changes in lower legs.  There is a family history of venous disorders.  The patient has used compression stockings in the past, but has used them on a regular daily basisi.    History reviewed. No pertinent past medical history.  Past Surgical History  Procedure Laterality Date  . Gastric bypass    . Cesarean section    . Shoulder surgery      Social History   Social History  . Marital Status: Married    Spouse Name: N/A  . Number of Children: N/A  . Years of Education: N/A   Occupational History  . Not on file.   Social History Main Topics  . Smoking status: Never Smoker   . Smokeless tobacco: Never Used  . Alcohol Use: No  . Drug Use: No  . Sexual Activity: Not on file   Other Topics Concern  . Not on file   Social History Narrative    Family History  Problem Relation Age of Onset  . Breast cancer Mother   . Cancer Mother   . COPD Father   . Heart disease Father   . Hyperlipidemia Father   . Hypertension Father   . Diabetes Brother   . Heart disease Brother   . Hyperlipidemia Brother   . Hypertension Brother     Current Outpatient Prescriptions on File Prior to Visit  Medication Sig Dispense Refill  . aspirin EC 81 MG tablet 2 po qd (Patient not taking: Reported on 10/29/2014)    . calcium carbonate (OS-CAL) 600 MG TABS Take 600 mg by mouth 2 (two) times daily with a meal.    . cholecalciferol (VITAMIN D) 1000 UNITS tablet Take 2,000 Units by mouth daily.    . ferrous sulfate 325 (65 FE) MG tablet Take 325 mg by mouth daily with  breakfast.    . fluticasone (FLONASE) 50 MCG/ACT nasal spray Place 2 sprays into both nostrils daily. (Patient not taking: Reported on 10/29/2014) 16 g 6  . MAGNESIUM CARBONATE PO Take by mouth daily.    . Multiple Vitamin (MULTIVITAMIN) tablet Take 1 tablet by mouth daily.    . Naltrexone-Bupropion HCl ER 8-90 MG TB12 2 po bid 120 tablet 2  . Probiotic Product (ALIGN PO) Take by mouth.    . vitamin B-12 (CYANOCOBALAMIN) 100 MCG tablet Take 50 mcg by mouth daily.     No current facility-administered medications on file prior to visit.    Allergies as of 02/15/2015  . (No Known Allergies)     ROS:   General:  No weight loss, Fever, chills  HEENT: No recent headaches, no nasal bleeding, no visual changes, no sore throat  Neurologic: No dizziness, blackouts, seizures. No recent symptoms of stroke or mini- stroke. No recent episodes of slurred speech, or temporary blindness.  Cardiac: No recent episodes of chest pain/pressure, no shortness of breath at rest.  No shortness of breath with exertion.  Denies history of atrial fibrillation or irregular heartbeat  Vascular: No history of rest pain in feet.  No history of claudication.  No history of non-healing ulcer, positive history of DVT left superficial with propagation from the calf to the thigh.  Current right chronic DVT on ultrasound.  Pulmonary: No home oxygen, no productive cough, no hemoptysis,  No asthma or wheezing  Musculoskeletal:  [ ]  Arthritis, [ ]  Low back pain,  [ ]  Joint pain  Hematologic:No history of hypercoagulable state.  No history of easy bleeding.  No history of anemia  Gastrointestinal: No hematochezia or melena,  No gastroesophageal reflux, no trouble swallowing  Urinary: [ ]  chronic Kidney disease, [ ]  on HD - [ ]  MWF or [ ]  TTHS, [ ]  Burning with urination, [ ]  Frequent urination, [ ]  Difficulty urinating;   Skin: No rashes  Psychological: No history of anxiety,  No history of depression  Physical  Examination  Filed Vitals:   02/15/15 1458  BP: 132/69  Pulse: 67  Height: 5\' 4"  (1.626 m)  Weight: 216 lb (97.977 kg)  SpO2: 100%    Body mass index is 37.06 kg/(m^2).  General:  Alert and oriented, no acute distress HEENT: Normal Neck: No bruit or JVD Pulmonary: Clear to auscultation bilaterally Cardiac: Regular Rate and Rhythm without murmur Abdomen: Soft, non-tender, non-distended, no mass, no scars Skin: No rash, varicose veins lateral and anterior lower legs Extremity Pulses:  2+ radial,  femoral, dorsalis pedis, posterior tibial pulses bilaterally Musculoskeletal: No deformity, positive lower extremity pitting  edema  Neurologic: Upper and lower extremity motor 5/5 and symmetric  DATA: 02/15/2015 Chronic non-occluded right DVT poplitea vein with reflux  Superficial thrombus left great saphenous with propagation from the lower leg to the thigh.  Incompetent left great saphenous veins.    Competent right great saphenous vein.  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    Plan: She could have laser ablation of the left Saphenous vein to help with the reflux and edema.  She will need to wear her thigh high compression stockings for 3 months daily.     She has thigh high compression stockings that she will wear and if she has continued symptoms that interfere with her ADL's she will f/u with one of our vein clinic physicians.   [facility]  Her first priority is knee surgery for which she can take 325 mg Aspirin for protective measures post op DVT.   COLLINS, EMMA MAUREEN PA-C  She was seen today in conjunction with Dr. Trula Slade    I agree with the above.  I have seen and evaluated the patient.  She has bilateral venous insufficiency.  She complains of swelling.  She has a history of a superficial thrombophlebitis in her left great saphenous vein.  She was placed on  anticoagulation because this propagated on subsequent imaging studies.  She stopped her anticoagulation early because of menstrual issues.  Her ultrasound today shows reflux within the left great saphenous vein as well as a chronic nonocclusive DVT in the right popliteal vein which had not been detected previously because her right leg had not been imaged.   The patient's swelling is secondary to venous insufficiency. She would potentially be a candidate for laser ablation of the left great saphenous vein.  However since she has  Reflux within the deep system of the right side, she will likely be relegated to  Compression stockings on the right.  Therefore I recommended that she try bilateral compression stockings as I  do not see the role of laser ablation of the left great saphenous only at this time.  She will contact me if this does not provide a satisfactory relief of her symptoms.  She is scheduled to undergo meniscus repair by Dr. Theda Sers.  The postoperative regimen of 325 aspirin twice a day should be sufficient as long as she can return to activity soon after her operation  Annamarie Major

## 2015-02-18 ENCOUNTER — Telehealth: Payer: Self-pay | Admitting: Family Medicine

## 2015-02-18 NOTE — Telephone Encounter (Signed)
Caller name: Terri Stevenson   Relationship to patient: Self   Can be reached: [phone]  Pharmacy: [id number] - [place], [place]  Reason for call: pt need a PA on her Naltrexone-Bupropion HCl medication.

## 2015-02-18 NOTE — Telephone Encounter (Signed)
Forwarded to Jessica G.

## 2015-02-22 NOTE — Telephone Encounter (Signed)
PA initiated. Awaiting determination. JG//CMA 

## 2015-02-23 ENCOUNTER — Other Ambulatory Visit: Payer: Self-pay | Admitting: Family Medicine

## 2015-02-23 MED ORDER — NALTREXONE-BUPROPION HCL ER 8-90 MG PO TB12: ORAL_TABLET | ORAL | Status: DC

## 2015-02-23 NOTE — Telephone Encounter (Signed)
Sent in

## 2015-02-23 NOTE — Telephone Encounter (Signed)
Pt called pharmacy and they do not have RX on file for contrave. Please send in RX as below it does not req PA. Pt has 1 dose for tomorrow morning.

## 2015-02-23 NOTE — Telephone Encounter (Signed)
Please advise if it ok to fill.     KP

## 2015-02-23 NOTE — Telephone Encounter (Signed)
Received fax from [facility] that [place] is not required. Faxed to pharmacy. JG//CMA

## 2015-02-24 NOTE — Telephone Encounter (Signed)
Rx called into the pharmacy.     KP 

## 2015-02-24 NOTE — Telephone Encounter (Signed)
Rx phoned in to the pharmacy.      KP

## 2015-03-25 ENCOUNTER — Ambulatory Visit (INDEPENDENT_AMBULATORY_CARE_PROVIDER_SITE_OTHER): Payer: Managed Care, Other (non HMO) | Admitting: Family Medicine

## 2015-03-25 VITALS — Ht 64.0 in | Wt 212.0 lb

## 2015-03-25 DIAGNOSIS — E669 Obesity, unspecified: Secondary | ICD-10-CM

## 2015-03-25 NOTE — Progress Notes (Signed)
Seen with Dr Jenne Campus in Nutrition clinic for Weight check  Assessment:    Primary concerns today: She is bored with current diet. She is very concerned about when does eat she occasionally throws up or gags. Additionally has headaches every AM upon waking that have been occuring for the last month. She also feels she cannot taste foods as well for last month When gets hungry wonders what to eat. Has difficulty finding what to eat for fear of getting sick. Has gagging even when thinks about eating and food. When she eats she feels she is "Eating but not enjoying food" Has not been able to eat as usual, tuna fish, chicken, these all seem bland. She has tried new things and got sick, i.e [place] sausage. She also reports massive diarrhea last week x4 days, the loose stools 2 more days. Went on [employer] and was able to tolerate this. However, has been able to eat Brendolyn Patty hamburgers + french fries without issue. She has never gotten sick with fruits, does not like vegetables so does not eat much of.   She usually tries to be physically active but had a knee surgery on 10/11 after which she has been getting PT twice per week. Normal is gyn three times per week with walking for 2 mi walk at night, 15 min walk at lunch on other days Has been kayaking previously, but has not neem since knee surgery   States she does not consider herself depressed. The gym and PT has been helpful. But is hard for her to assess her mood. When asked states " does not know how to answer that" Has been reading " eating in the light of the moon" and has yet to start the question portion   24-hr recall suggest kcal intake 1050 over this day, likely over estimate (Up at  6 AM)  B ( 7 AM)-  2 oz Chicken, on pumpernickel bread one slice with tomato slice, regular mayonaisse and ketchup  Snk ( 10:30 AM)-    Atkins protein shake L (12:30 PM)-  Half tuna sandwich with regular mayonaisse and celery,  Snk ( 3 PM)-  1/2 cup popcorn   D (6:30 PM)-  3 oz BB chicken without skin (ate 1/2 of this), 1 tbsp mashed potatoes, green beans  Snk ( 7:30 PM)-  Luna bar, tea ( 1 tsp sugar)  Typical day? Yes.     Recent physical activity includes- 2 mi walking, yesterday 11/2  Handouts given during visit include:  AVS   Follow up on May 06, 2015  Alyssa A. Lincoln Brigham MD, [facility] Resident PGY-2 Pager [phone] .

## 2015-03-25 NOTE — Patient Instructions (Addendum)
Recognize your feeling regarding food and how they can be perceived as physical symptoms You are ABLE to control these physical symptoms caused by feelings Consider drinking more water each day as well as more carbohydrates  We recommend you eat more vegetables per day. Obtain twice as many veg's as protein or carbohydrate foods for both lunch and dinner. If you are unable to eat vegetable, add a fruit to lunch. Increase vegetables for dinner     Make three lists of vegetables: (1) those you like and eat now; (2) vegetables you won't even consider; and (3) vegetables you might consider trying if they are prepared a certain way.  Continue to eat veg's you currently eat, but from this last list, choose a vegetable to try at least 3 times a week.  Use small amounts of this vegetable, cut small, combined with foods or seasonings you like.    Please discuss with Manuela Schwartz, your therapist, about the gagging and concern for nausea regarding food   Follow up with Dr Jenne Campus on May 06, 2015

## 2015-04-01 ENCOUNTER — Ambulatory Visit: Payer: Managed Care, Other (non HMO) | Admitting: Family Medicine

## 2015-04-29 ENCOUNTER — Ambulatory Visit: Payer: Managed Care, Other (non HMO) | Admitting: Family Medicine

## 2015-05-04 ENCOUNTER — Other Ambulatory Visit (HOSPITAL_BASED_OUTPATIENT_CLINIC_OR_DEPARTMENT_OTHER): Payer: Self-pay | Admitting: Obstetrics and Gynecology

## 2015-05-04 DIAGNOSIS — Z1231 Encounter for screening mammogram for malignant neoplasm of breast: Secondary | ICD-10-CM

## 2015-05-06 ENCOUNTER — Ambulatory Visit (HOSPITAL_BASED_OUTPATIENT_CLINIC_OR_DEPARTMENT_OTHER)
Admission: RE | Admit: 2015-05-06 | Discharge: 2015-05-06 | Disposition: A | Discharge status: Home / Self Care | Payer: Managed Care, Other (non HMO) | Source: Ambulatory Visit | Attending: Obstetrics and Gynecology | Admitting: Obstetrics and Gynecology

## 2015-05-06 ENCOUNTER — Ambulatory Visit: Payer: Managed Care, Other (non HMO) | Admitting: Family Medicine

## 2015-05-06 DIAGNOSIS — Z1231 Encounter for screening mammogram for malignant neoplasm of breast: Secondary | ICD-10-CM | POA: Insufficient documentation

## 2015-05-27 ENCOUNTER — Ambulatory Visit: Payer: Managed Care, Other (non HMO) | Admitting: Family Medicine

## 2015-06-07 ENCOUNTER — Telehealth: Payer: Self-pay | Admitting: Family Medicine

## 2015-06-23 NOTE — Telephone Encounter (Signed)
.  mychart

## 2015-07-01 ENCOUNTER — Ambulatory Visit (INDEPENDENT_AMBULATORY_CARE_PROVIDER_SITE_OTHER): Payer: Managed Care, Other (non HMO) | Admitting: Family Medicine

## 2015-07-01 DIAGNOSIS — E669 Obesity, unspecified: Secondary | ICD-10-CM

## 2015-07-01 NOTE — Progress Notes (Signed)
Primary Nutrition Concern:   Feels she is struggling, wants to get to 190lbs. Was sick with the flu in January. Feels her weight has been fluctuating a lot in the presence of the sickness, including creeping back up; targets 220lbs as a major cut off. Felt really hungry yesterday.   Usual physical activity includes 3 days a week, class/group exercise. 20 minutes weights, 20 minutes cardio, 20 minutes stretching.  24-hr recall: 1620 calories in day (Up at  AM) B ( 7-7:30 AM)-  1 cup fruit, strawberries/blueberry. Cup of Special K, 2oz 2% milk. 206 calories  Snk ( 9:30 AM)-  1/2 ham sandwich. 84 calories Snk (10 AM)-      1/2 cup cottage cheese/mandarin orange/cool whip/sugar free orange jello. 138 calories L ( 12 PM)-  Garden salad, 2 tsp [place] dressing. Tried canned soup, drank half. 302 calories Snk ( 3-330 PM)-  27 cheez it crackers, 8oz cokes. 140+150 = 290 calories D ( 7-30 PM)-  4oz baked chicken, 1/4 cup mashed potatoes/butter, 1/4 cup broccoli/cauliflower. 285 calories Snk ( 9 PM)-    3 girl scout cookies - 255 calories. Typical day? No.  Handouts given during visit include:  AVS  Increase protein at breakfast  Demonstrated degree of understanding via:  Teach Back  Total time spent face to face with patient was 60 minutes, of which >50% was spent on counseling and discussion of nutrition and obesity.

## 2015-07-01 NOTE — Patient Instructions (Addendum)
Breakfast: Track what you have, how much, what time. Now record what time do you first notice feeling hungry After a few weeks compare these 2, figure out what days you felt hungry quicker and see if you are eating enough protein on those days.   On average: 7 g protein for every 1 ounce of meat/fish/chicken  Protein with each meal.    Cabot reduced fat cheese  [place] - a way to mix in new vegetables to your diet to teach your taste!

## 2015-07-29 ENCOUNTER — Encounter: Payer: Self-pay | Admitting: Family Medicine

## 2015-07-29 ENCOUNTER — Ambulatory Visit (INDEPENDENT_AMBULATORY_CARE_PROVIDER_SITE_OTHER): Payer: Managed Care, Other (non HMO) | Admitting: Family Medicine

## 2015-07-29 VITALS — Ht 64.0 in | Wt 207.9 lb

## 2015-07-29 DIAGNOSIS — Z6834 Body mass index (BMI) 34.0-34.9, adult: Secondary | ICD-10-CM

## 2015-07-29 DIAGNOSIS — E669 Obesity, unspecified: Secondary | ICD-10-CM | POA: Diagnosis not present

## 2015-07-29 NOTE — Progress Notes (Signed)
Primary Nutrition Concern:   She has increased her proteins. Examples include cashew cauliflower and eating cottage cheese with berries. She has not been eating much vegetables because she does not like them. She reports some stress at home as her husband has recently been laid off. He cooks a lot of "comfort food" which she does eat.  Usual physical activity includes 3 days a week, class/group exercise. 20 minutes weights, 20 minutes cardio, 20 minutes stretching.  24-hr recall: 1000 calories in day (Up at 6 AM) Snk ( 7:30 AM)- Banana Snk (10 AM)-    cinnamon toast L ( 2:30 PM)-   Tuna salad, pumpernickel, tomato, mayonaise Snk ( 4 PM)-  2 cup of Skinny popcorn D ( 6 PM)-   1 cup of mac, spaghetti sauce and ground chicken Typical day? No. She had an appointment early in the morning  Handouts given during visit include:  AVS  Demonstrated degree of understanding via:  Teach Back  Total time spent face to face with patient was 60 minutes, of which >50% was spent on counseling and discussion of nutrition and obesity.

## 2015-07-29 NOTE — Patient Instructions (Signed)
Terri Stevenson, it was great seeing you today. Congratulations on your weight loss!!! You've lost about 5 lbs since one month ago!  We discussed your gut microbiology today. You have to "feed those suckers." We decided that you will increase your vegetable intake. Calories are not equal. These calories will be more beneficial to you than the girl scout cookies (don't forget to let your friend know!). You can start with a half cup of vegetables with your lunch and dinner and hopefully increase to at least 1 cup.  Please follow-up in one month

## 2015-08-06 ENCOUNTER — Telehealth: Payer: Self-pay | Admitting: Family Medicine

## 2015-08-06 NOTE — Telephone Encounter (Signed)
Updated.      KP 

## 2015-08-06 NOTE — Telephone Encounter (Signed)
Patient declined flu shot  °

## 2015-08-31 ENCOUNTER — Other Ambulatory Visit: Payer: Self-pay | Admitting: Obstetrics and Gynecology

## 2015-08-31 ENCOUNTER — Encounter: Payer: Self-pay | Admitting: Family Medicine

## 2015-09-02 ENCOUNTER — Ambulatory Visit (INDEPENDENT_AMBULATORY_CARE_PROVIDER_SITE_OTHER): Payer: Managed Care, Other (non HMO) | Admitting: Family Medicine

## 2015-09-02 VITALS — Ht 64.0 in | Wt 216.9 lb

## 2015-09-02 DIAGNOSIS — E669 Obesity, unspecified: Secondary | ICD-10-CM | POA: Diagnosis not present

## 2015-09-02 NOTE — Patient Instructions (Addendum)
It was nice to meet you! We've got some goals for the next 4 weeks until we meet again:   Come up with at least 7 meals that will be "go-to" meals for you that include all 3 of the following:  - protein - carbohydrates/starches - vegetables / fruits.   Look at your list of vegetables that you "may" have, and eat one 3 times per week. This will give you enough exposures (even if it's on pizza, covered in butter, etc.) that should help change your taste preferences. What you LIKE to eat often comes from what you OFTEN eat.   - Eating pattern: at least 3 *real* meals and 1 - 2 snacks per day. - A real meal is protein, starch and fruits/vegetables. Another definition: Something that you would serve to a guest in your home as a meal.   We seek progress, not perfection.  Breakfast ideas: - Cottage cheese, berries, granola.  - Wraps (high protein, low carb)   Lunch ideas: Don't forget protein! - Salad with 3 - 4 oz of protein, chicken for example.   None of these are your enemies: Fat, Carbs, Protein. Everything in moderation.   Please call Terri Stevenson with an update about your plans when you know more.

## 2015-09-02 NOTE — Progress Notes (Signed)
Primary Nutrition Concern: Weight loss  She injured her knee, requiring crutches, over the past month and has been told she needs to have a TKA.   She saw Dr. Mancel Bale, [facility], who performed an endometrial biopsy last week for post-menopausal bleeding. She is concerned over the diagnosis from this, and will follow up soon. This has worsened her stress level.   Went off contrave because she wanted to see if she could control her cravings. Her hunger has increased dramatically since that time. She's been much more restrictive. She will drink milk intentionally to cause her to have an upset stomach (lactose intolerance). Will ask PCP for refill on contrave on Tuesday.   Usual physical activity: Still going to the gym mostly on core and upper body. 3x/week for about 60 minutes each. Missed exercise x2.5 weeks due to knee injury. Walks the dog about 20 minutes per night despite knee pain.   24-hr recall: (Up at 5:17 AM) BRKFST: 5:30 AM: banana with 1 tsp PB, 2c water Snk ( 8 AM)- pumpernikel toast with small slab of butter and black tea Snk ( 11:30am): Sandwich on pupernikel with cheese, lettuce and tomato 1:00 PM 150cal protein shake.  L ( 4 PM)- [place] hotdog, pumpernikel, onions    D ( 7:30 PM)- taco salad: 1c lettuce, about 1/4 lb ground beef    Typical day? not: minimal food choices available in the house, had to go shopping prior to dinner.   Handouts given during visit include:  AVS  Demonstrated degree of understanding via:  Teach Back  Total time spent face to face with patient was 60 minutes, of which >50% was spent on counseling and discussion of nutrition and obesity.

## 2015-09-07 ENCOUNTER — Encounter: Payer: Self-pay | Admitting: Family Medicine

## 2015-09-07 ENCOUNTER — Ambulatory Visit (INDEPENDENT_AMBULATORY_CARE_PROVIDER_SITE_OTHER): Payer: Managed Care, Other (non HMO) | Admitting: Family Medicine

## 2015-09-07 VITALS — BP 118/70 | HR 68 | Temp 98.4°F | Wt 219.2 lb

## 2015-09-07 DIAGNOSIS — Z01810 Encounter for preprocedural cardiovascular examination: Secondary | ICD-10-CM | POA: Diagnosis not present

## 2015-09-07 MED ORDER — NALTREXONE-BUPROPION HCL ER 8-90 MG PO TB12: 2.0000 | ORAL_TABLET | Freq: Two times a day (BID) | ORAL | Status: DC

## 2015-09-07 NOTE — Progress Notes (Signed)
Subjective:    Terri Stevenson is a 60 y.o. female who presents to the office today for a preoperative consultation at the request of surgeon Dr Theda Sers who plans on performing TKA on TBA.  This consultation is requested for the specific conditions prompting preoperative evaluation (i.e. because of potential affect on operative risk): age. Planned anesthesia: general. The patient has the following known anesthesia issues: none. Patients bleeding risk: no recent abnormal bleeding. Patient does not have objections to receiving blood products if needed.  The following portions of the patient's history were reviewed and updated as appropriate:  She  has no past medical history on file. She  does not have any pertinent problems on file. She  has past surgical history that includes Gastric bypass; Cesarean section; and Shoulder surgery. Her family history includes Breast cancer in her mother; COPD in her father; Cancer in her mother; Diabetes in her brother; Heart disease in her brother and father; Hyperlipidemia in her brother and father; Hypertension in her brother and father. She  reports that she has never smoked. She has never used smokeless tobacco. She reports that she does not drink alcohol or use illicit drugs. She has a current medication list which includes the following prescription(s): aspirin ec, calcium carbonate, cholecalciferol, ferrous sulfate, multivitamin, naltrexone-bupropion hcl er, probiotic product, and vitamin b-12. Current Outpatient Prescriptions on File Prior to Visit  Medication Sig Dispense Refill  . aspirin EC 81 MG tablet 2 po qd (Patient taking differently: Take 81 mg by mouth once. )    . calcium carbonate (OS-CAL) 600 MG TABS Take 600 mg by mouth 2 (two) times daily with a meal.    . cholecalciferol (VITAMIN D) 1000 UNITS tablet Take 2,000 Units by mouth daily.    . ferrous sulfate 325 (65 FE) MG tablet Take 325 mg by mouth daily with breakfast.    . Multiple Vitamin  (MULTIVITAMIN) tablet Take 1 tablet by mouth daily.    . Probiotic Product (ALIGN PO) Take by mouth.    . vitamin B-12 (CYANOCOBALAMIN) 100 MCG tablet Take 50 mcg by mouth daily.     No current facility-administered medications on file prior to visit.   She has No Known Allergies..  Review of Systems   Review of Systems  Constitutional: Negative for activity change, appetite change and fatigue.  HENT: Negative for hearing loss, congestion, tinnitus and ear discharge.   Eyes: Negative for visual disturbance  Respiratory: Negative for cough, chest tightness and shortness of breath.   Cardiovascular: Negative for chest pain, palpitations and leg swelling.  Gastrointestinal: Negative for abdominal pain, diarrhea, constipation and abdominal distention.  Genitourinary: Negative for urgency, frequency, decreased urine volume and difficulty urinating.  Musculoskeletal: Negative for back pain, arthralgias and gait problem.  Skin: Negative for color change, pallor and rash.  Neurological: Negative for dizziness, light-headedness, numbness and headaches.  Hematological: Negative for adenopathy. Does not bruise/bleed easily.  Psychiatric/Behavioral: Negative for suicidal ideas, confusion, sleep disturbance, self-injury, dysphoric mood, decreased concentration and agitation.          Objective:    BP 118/70 mmHg  Pulse 68  Temp(Src) 98.4 F (36.9 C) (Oral)  Wt 219 lb 3.2 oz (99.428 kg)  SpO2 98% General appearance: alert, cooperative, appears stated age and no distress Head: Normocephalic, without obvious abnormality, atraumatic Eyes: negative findings: lids and lashes normal and pupils equal, round, reactive to light and accomodation Ears: normal TM's and external ear canals both ears Nose: Nares normal. Septum midline.  Mucosa normal. No drainage or sinus tenderness. Throat: lips, mucosa, and tongue normal; teeth and gums normal Neck: no adenopathy, supple, symmetrical, trachea  midline and thyroid not enlarged, symmetric, no tenderness/mass/nodules Back: symmetric, no curvature. ROM normal. No CVA tenderness. Lungs: clear to auscultation bilaterally Heart: S1, S2 normal Abdomen: soft, non-tender; bowel sounds normal; no masses,  no organomegaly Extremities: extremities normal, atraumatic, no cyanosis or edema---B/L knee pain R>L Pulses: 2+ and symmetric Skin: Skin color, texture, turgor normal. No rashes or lesions Lymph nodes: Cervical, supraclavicular, and axillary nodes normal. Neurologic: Alert and oriented X 3, normal strength and tone. Normal symmetric reflexes. Normal coordination and gait    Cardiographics ECG: normal sinus rhythm, no blocks or conduction defects, no ischemic changes Echocardiogram: not done  Lab Review  No visits with results within 6 Month(s) from this visit. Latest known visit with results is:  Appointment on 05/06/2014  Component Date Value  . Vitamin B-12 05/06/2014 756       Assessment:      60 y.o. female with planned surgery as above.   Known risk factors for perioperative complications: None   Difficulty with intubation is not anticipated.  Cardiac Risk Estimation: low  Current medications which may produce withdrawal symptoms if withheld perioperatively: n/a      Plan:    1. Preoperative workup as follows ECG. 2. Change in medication regimen before surgery: not applicable, not on any medications. 3. Prophylaxis for cardiac events with perioperative beta-blockers: not indicated. 4. Invasive hemodynamic monitoring perioperatively: not indicated. 5. Deep vein thrombosis prophylaxis postoperatively:regimen to be chosen by surgical team. 6. Surveillance for postoperative MI with ECG immediately postoperatively and on postoperative days 1 and 2 AND troponin levels 24 hours postoperatively and on day 4 or hospital discharge (whichever comes first): not indicated. 7. Other measures: consult triad hospitalist if needed

## 2015-09-07 NOTE — Progress Notes (Signed)
Pre visit review using our clinic review tool, if applicable. No additional management support is needed unless otherwise documented below in the visit note. 

## 2015-09-08 ENCOUNTER — Telehealth: Payer: Self-pay | Admitting: *Deleted

## 2015-09-08 NOTE — Telephone Encounter (Signed)
PA approved. JG//CMA 

## 2015-09-10 ENCOUNTER — Other Ambulatory Visit: Payer: Managed Care, Other (non HMO)

## 2015-09-15 ENCOUNTER — Other Ambulatory Visit: Payer: Self-pay | Admitting: Orthopedic Surgery

## 2015-09-20 NOTE — H&P (Signed)
TOTAL KNEE ADMISSION H&P  Patient is being admitted for right total knee arthroplasty.  Subjective:  Chief Complaint:right knee pain.  HPI: Terri Stevenson, 60 y.o. female, has a history of pain and functional disability in the right knee due to arthritis and has failed non-surgical conservative treatments for greater than 12 weeks to includeNSAID's and/or analgesics, corticosteriod injections, viscosupplementation injections, use of assistive devices, weight reduction as appropriate and activity modification.  Onset of symptoms was gradual, starting 4 years ago with gradually worsening course since that time. The patient noted prior procedures on the knee to include  arthroscopy on the right knee(s).  Patient currently rates pain in the right knee(s) at 7 out of 10 with activity. Patient has worsening of pain with activity and weight bearing, pain that interferes with activities of daily living, pain with passive range of motion and joint swelling.  Patient has evidence of subchondral sclerosis, periarticular osteophytes and joint space narrowing by imaging studies. There is no active infection.  Patient Active Problem List   Diagnosis Date Noted  . BMI 34.0-34.9,adult 03/24/2014  . Superficial thrombosis of left lower extremity 01/08/2014  . Sinusitis, acute maxillary 06/06/2013  . Cerumen impaction 02/21/2012  . BACK PAIN WITH RADICULOPATHY 12/08/2009  . UNSPECIFIED VITAMIN D DEFICIENCY 12/03/2009  . MOLE 11/12/2007  . Obesity 10/24/2006  . HYPERLIPIDEMIA 09/24/2006  . VARICOSE VEIN 09/24/2006  . DYSMENORRHEA 09/24/2006   No past medical history on file.  Past Surgical History  Procedure Laterality Date  . Gastric bypass    . Cesarean section    . Shoulder surgery      No prescriptions prior to admission   No Known Allergies  Social History  Substance Use Topics  . Smoking status: Never Smoker   . Smokeless tobacco: Never Used  . Alcohol Use: No    Family History  Problem  Relation Age of Onset  . Breast cancer Mother   . Cancer Mother   . COPD Father   . Heart disease Father   . Hyperlipidemia Father   . Hypertension Father   . Diabetes Brother   . Heart disease Brother   . Hyperlipidemia Brother   . Hypertension Brother      Review of Systems  Constitutional: Negative.   HENT: Negative.   Eyes: Negative.   Respiratory: Negative.   Cardiovascular: Negative.   Gastrointestinal: Negative.   Genitourinary: Negative.   Musculoskeletal: Positive for joint pain.  Skin: Negative.   Neurological: Negative.   Endo/Heme/Allergies: Negative.   Psychiatric/Behavioral: Negative.     Objective:  Physical Exam  Constitutional: She is oriented to person, place, and time.  HENT:  Head: Normocephalic.  Eyes: EOM are normal.  Neck: Normal range of motion.  Cardiovascular: Normal rate, normal heart sounds and intact distal pulses.   Respiratory: Effort normal and breath sounds normal.  GI: Soft.  Genitourinary:  deferred  Musculoskeletal:  Right knee pain. RLE grossly n/v intact. Knee stable on exam.  Neurological: She is alert and oriented to person, place, and time. She has normal reflexes.  Skin: Skin is warm and dry.  Psychiatric: Her behavior is normal.    Vital signs in last 24 hours:    Labs:   Estimated body mass index is 37.61 kg/(m^2) as calculated from the following:   Height as of 09/02/15: 5\' 4"  (1.626 m).   Weight as of 09/07/15: 99.428 kg (219 lb 3.2 oz).   Imaging Review Plain radiographs demonstrate severe degenerative joint disease of the  right knee(s). The overall alignment ismild varus. The bone quality appears to be fair for age and reported activity level.  Assessment/Plan:  End stage arthritis, right knee   The patient history, physical examination, clinical judgment of the provider and imaging studies are consistent with end stage degenerative joint disease of the right knee(s) and total knee arthroplasty is deemed  medically necessary. The treatment options including medical management, injection therapy arthroscopy and arthroplasty were discussed at length. The risks and benefits of total knee arthroplasty were presented and reviewed. The risks due to aseptic loosening, infection, stiffness, patella tracking problems, thromboembolic complications and other imponderables were discussed. The patient acknowledged the explanation, agreed to proceed with the plan and consent was signed. Patient is being admitted for inpatient treatment for surgery, pain control, PT, OT, prophylactic antibiotics, VTE prophylaxis, progressive ambulation and ADL's and discharge planning. The patient is planning to be discharged home with home health services.  Will not use IV tranexamic acid. Contraindications and adverse affects of Tranexamic acid discussed in detail. Patient has history of blood.

## 2015-09-22 ENCOUNTER — Encounter (HOSPITAL_COMMUNITY)
Admission: RE | Admit: 2015-09-22 | Discharge: 2015-09-22 | Disposition: A | Discharge status: Home / Self Care | Payer: Managed Care, Other (non HMO) | Source: Ambulatory Visit | Attending: Specialist | Admitting: Specialist

## 2015-09-22 ENCOUNTER — Encounter (HOSPITAL_COMMUNITY): Payer: Self-pay

## 2015-09-22 DIAGNOSIS — M1711 Unilateral primary osteoarthritis, right knee: Secondary | ICD-10-CM | POA: Diagnosis not present

## 2015-09-22 DIAGNOSIS — Z01812 Encounter for preprocedural laboratory examination: Secondary | ICD-10-CM | POA: Diagnosis not present

## 2015-09-22 DIAGNOSIS — Z0183 Encounter for blood typing: Secondary | ICD-10-CM | POA: Insufficient documentation

## 2015-09-22 HISTORY — DX: Acute embolism and thrombosis of unspecified vein: I82.90

## 2015-09-22 HISTORY — DX: Unspecified osteoarthritis, unspecified site: M19.90

## 2015-09-22 LAB — PROTIME-INR
INR: 1 (ref 0.00–1.49)
PROTHROMBIN TIME: 13 s (ref 11.6–15.2)

## 2015-09-22 LAB — BASIC METABOLIC PANEL
ANION GAP: 9 (ref 5–15)
BUN: 13 mg/dL (ref 6–20)
CALCIUM: 9 mg/dL (ref 8.9–10.3)
CO2: 29 mmol/L (ref 22–32)
Chloride: 104 mmol/L (ref 101–111)
Creatinine, Ser: 0.99 mg/dL (ref 0.44–1.00)
GFR calc non Af Amer: >60 mL/min (ref >60)
Glucose, Bld: 95 mg/dL (ref 65–99)
Potassium: 4.3 mmol/L (ref 3.5–5.1)
SODIUM: 142 mmol/L (ref 135–145)

## 2015-09-22 LAB — CBC
HCT: 42.2 % (ref 36.0–46.0)
HEMOGLOBIN: 13.6 g/dL (ref 12.0–15.0)
MCH: 29.1 pg (ref 26.0–34.0)
MCHC: 32.2 g/dL (ref 30.0–36.0)
MCV: 90.4 fL (ref 78.0–100.0)
Platelets: 259 10*3/uL (ref 150–400)
RBC: 4.67 MIL/uL (ref 3.87–5.11)
RDW: 14.7 % (ref 11.5–15.5)
WBC: 5.6 10*3/uL (ref 4.0–10.5)

## 2015-09-22 LAB — URINALYSIS, ROUTINE W REFLEX MICROSCOPIC
BILIRUBIN URINE: NEGATIVE
GLUCOSE, UA: NEGATIVE mg/dL
HGB URINE DIPSTICK: NEGATIVE
KETONES UR: NEGATIVE mg/dL
Leukocytes, UA: NEGATIVE
NITRITE: NEGATIVE
PH: 6.5 (ref 5.0–8.0)
Protein, ur: NEGATIVE mg/dL
SPECIFIC GRAVITY, URINE: 1.026 (ref 1.005–1.030)

## 2015-09-22 LAB — HCG, SERUM, QUALITATIVE: Preg, Serum: NEGATIVE

## 2015-09-22 LAB — SURGICAL PCR SCREEN
MRSA, PCR: NEGATIVE
Staphylococcus aureus: NEGATIVE

## 2015-09-22 LAB — ABO/RH: ABO/RH(D): A POS

## 2015-09-22 LAB — APTT: aPTT: 27 seconds (ref 24–37)

## 2015-09-22 NOTE — Progress Notes (Signed)
Clearance note per chart per Dr Etter Sjogren 09/07/2015 EKG per chart 09/07/2015

## 2015-09-22 NOTE — Patient Instructions (Signed)
Laura-Lee Krodel  09/22/2015   Your procedure is scheduled on: Friday Oct 01, 2015  Report to [facility]  Entrance take [place]  elevators to 3rd floor to  [place] at 11:00 AM.  Call this number if you have problems the morning of surgery [phone]   Remember: ONLY 1 PERSON MAY GO WITH YOU TO SHORT STAY TO GET  READY MORNING OF [place].  Do not eat food After Midnight but may take clear liquids till 7:00 am day of surgery then nothing by mouth.      Take these medicines the morning of surgery with A SIP OF WATER: NONE                               You may not have any metal on your body including hair pins and              piercings  Do not wear jewelry, make-up, lotions, powders or perfumes, deodorant             Do not wear nail polish.  Do not shave  48 hours prior to surgery.                Do not bring valuables to the hospital. [place].  Contacts, dentures or bridgework may not be worn into surgery.  Leave suitcase in the car. After surgery it may be brought to your room.                Please read over the following fact sheets you were given:MRSA INFORMATION SHEET; INCENTIVE SPIROMETER; BLOOD TRANSFUSION INFORMATION SHEET _____________________________________________________________________             [facility] - Preparing for Surgery Before surgery, you can play an important role.  Because skin is not sterile, your skin needs to be as free of germs as possible.  You can reduce the number of germs on your skin by washing with CHG (chlorahexidine gluconate) soap before surgery.  CHG is an antiseptic cleaner which kills germs and bonds with the skin to continue killing germs even after washing. Please DO NOT use if you have an allergy to CHG or antibacterial soaps.  If your skin becomes reddened/irritated stop using the CHG and inform your nurse when you arrive at Short Stay. Do not  shave (including legs and underarms) for at least 48 hours prior to the first CHG shower.  You may shave your face/neck. Please follow these instructions carefully:  1.  Shower with CHG Soap the night before surgery and the  morning of Surgery.  2.  If you choose to wash your hair, wash your hair first as usual with your  normal  shampoo.  3.  After you shampoo, rinse your hair and body thoroughly to remove the  shampoo.                           4.  Use CHG as you would any other liquid soap.  You can apply chg directly  to the skin and wash  Gently with a scrungie or clean washcloth.  5.  Apply the CHG Soap to your body ONLY FROM THE NECK DOWN.   Do not use on face/ open                           Wound or open sores. Avoid contact with eyes, ears mouth and genitals (private parts).                       Wash face,  Genitals (private parts) with your normal soap.             6.  Wash thoroughly, paying special attention to the area where your surgery  will be performed.  7.  Thoroughly rinse your body with warm water from the neck down.  8.  DO NOT shower/wash with your normal soap after using and rinsing off  the CHG Soap.                9.  Pat yourself dry with a clean towel.            10.  Wear clean pajamas.            11.  Place clean sheets on your bed the night of your first shower and do not  sleep with pets. Day of Surgery : Do not apply any lotions/deodorants the morning of surgery.  Please wear clean clothes to the hospital/surgery center.  FAILURE TO FOLLOW THESE INSTRUCTIONS MAY RESULT IN THE CANCELLATION OF YOUR SURGERY PATIENT SIGNATURE_________________________________  NURSE SIGNATURE__________________________________  ________________________________________________________________________   Adam Phenix  An incentive spirometer is a tool that can help keep your lungs clear and active. This tool measures how well you are filling your lungs  with each breath. Taking long deep breaths may help reverse or decrease the chance of developing breathing (pulmonary) problems (especially infection) following:  A long period of time when you are unable to move or be active. BEFORE THE PROCEDURE   If the spirometer includes an indicator to show your best effort, your nurse or respiratory therapist will set it to a desired goal.  If possible, sit up straight or lean slightly forward. Try not to slouch.  Hold the incentive spirometer in an upright position. INSTRUCTIONS FOR USE   Sit on the edge of your bed if possible, or sit up as far as you can in bed or on a chair.  Hold the incentive spirometer in an upright position.  Breathe out normally.  Place the mouthpiece in your mouth and seal your lips tightly around it.  Breathe in slowly and as deeply as possible, raising the piston or the ball toward the top of the column.  Hold your breath for 3-5 seconds or for as long as possible. Allow the piston or ball to fall to the bottom of the column.  Remove the mouthpiece from your mouth and breathe out normally.  Rest for a few seconds and repeat Steps 1 through 7 at least 10 times every 1-2 hours when you are awake. Take your time and take a few normal breaths between deep breaths.  The spirometer may include an indicator to show your best effort. Use the indicator as a goal to work toward during each repetition.  After each set of 10 deep breaths, practice coughing to be sure your lungs are clear. If you have an incision (the cut made at the time of surgery),  support your incision when coughing by placing a pillow or rolled up towels firmly against it. Once you are able to get out of bed, walk around indoors and cough well. You may stop using the incentive spirometer when instructed by your caregiver.  RISKS AND COMPLICATIONS  Take your time so you do not get dizzy or light-headed.  If you are in pain, you may need to take or ask  for pain medication before doing incentive spirometry. It is harder to take a deep breath if you are having pain. AFTER USE  Rest and breathe slowly and easily.  It can be helpful to keep track of a log of your progress. Your caregiver can provide you with a simple table to help with this. If you are using the spirometer at home, follow these instructions: [place] IF:   You are having difficultly using the spirometer.  You have trouble using the spirometer as often as instructed.  Your pain medication is not giving enough relief while using the spirometer.  You develop fever of 100.5 F (38.1 C) or higher. SEEK IMMEDIATE MEDICAL CARE IF:   You cough up bloody sputum that had not been present before.  You develop fever of 102 F (38.9 C) or greater.  You develop worsening pain at or near the incision site. MAKE SURE YOU:   Understand these instructions.  Will watch your condition.  Will get help right away if you are not doing well or get worse. Document Released: 09/18/2006 Document Revised: 07/31/2011 Document Reviewed: 11/19/2006 ExitCare Patient Information 2014 ExitCare, [place].   ________________________________________________________________________  WHAT IS A BLOOD TRANSFUSION? Blood Transfusion Information  A transfusion is the replacement of blood or some of its parts. Blood is made up of multiple cells which provide different functions.  Red blood cells carry oxygen and are used for blood loss replacement.  White blood cells fight against infection.  Platelets control bleeding.  Plasma helps clot blood.  Other blood products are available for specialized needs, such as hemophilia or other clotting disorders. BEFORE THE TRANSFUSION  Who gives blood for transfusions?   Healthy volunteers who are fully evaluated to make sure their blood is safe. This is blood bank blood. Transfusion therapy is the safest it has ever been in the practice of  medicine. Before blood is taken from a donor, a complete history is taken to make sure that person has no history of diseases nor engages in risky social behavior (examples are intravenous drug use or sexual activity with multiple partners). The donor's travel history is screened to minimize risk of transmitting infections, such as malaria. The donated blood is tested for signs of infectious diseases, such as HIV and hepatitis. The blood is then tested to be sure it is compatible with you in order to minimize the chance of a transfusion reaction. If you or a relative donates blood, this is often done in anticipation of surgery and is not appropriate for emergency situations. It takes many days to process the donated blood. RISKS AND COMPLICATIONS Although transfusion therapy is very safe and saves many lives, the main dangers of transfusion include:   Getting an infectious disease.  Developing a transfusion reaction. This is an allergic reaction to something in the blood you were given. Every precaution is taken to prevent this. The decision to have a blood transfusion has been considered carefully by your caregiver before blood is given. Blood is not given unless the benefits outweigh the risks. AFTER THE TRANSFUSION  Right after receiving a blood transfusion, you will usually feel much better and more energetic. This is especially true if your red blood cells have gotten low (anemic). The transfusion raises the level of the red blood cells which carry oxygen, and this usually causes an energy increase.  The nurse administering the transfusion will monitor you carefully for complications. HOME CARE INSTRUCTIONS  No special instructions are needed after a transfusion. You may find your energy is better. Speak with your caregiver about any limitations on activity for underlying diseases you may have. SEEK MEDICAL CARE IF:   Your condition is not improving after your transfusion.  You develop  redness or irritation at the intravenous (IV) site. SEEK IMMEDIATE MEDICAL CARE IF:  Any of the following symptoms occur over the next 12 hours:  Shaking chills.  You have a temperature by mouth above 102 F (38.9 C), not controlled by medicine.  Chest, back, or muscle pain.  People around you feel you are not acting correctly or are confused.  Shortness of breath or difficulty breathing.  Dizziness and fainting.  You get a rash or develop hives.  You have a decrease in urine output.  Your urine turns a dark color or changes to pink, red, or brown. Any of the following symptoms occur over the next 10 days:  You have a temperature by mouth above 102 F (38.9 C), not controlled by medicine.  Shortness of breath.  Weakness after normal activity.  The white part of the eye turns yellow (jaundice).  You have a decrease in the amount of urine or are urinating less often.  Your urine turns a dark color or changes to pink, red, or brown. Document Released: 05/05/2000 Document Revised: 07/31/2011 Document Reviewed: 12/23/2007 ExitCare Patient Information 2014 ExitCare, [place].  _______________________________________________________________________   CLEAR LIQUID DIET   Foods Allowed                                                                     Foods Excluded  Coffee and tea, regular and decaf                             liquids that you cannot  Plain Jell-O in any flavor                                             see through such as: Fruit ices (not with fruit pulp)                                     milk, soups, orange juice  Iced Popsicles                                    All solid food Carbonated beverages, regular and diet  Cranberry, grape and apple juices Sports drinks like Gatorade Lightly seasoned clear broth or consume(fat free) Sugar, honey syrup  Sample Menu Breakfast                                Lunch                                      Supper Cranberry juice                    Beef broth                            Chicken broth Jell-O                                     Grape juice                           Apple juice Coffee or tea                        Jell-O                                      Popsicle                                                Coffee or tea                        Coffee or tea  _____________________________________________________________________

## 2015-09-30 ENCOUNTER — Encounter: Payer: Self-pay | Admitting: Family Medicine

## 2015-09-30 ENCOUNTER — Ambulatory Visit: Payer: Managed Care, Other (non HMO) | Admitting: Family Medicine

## 2015-09-30 NOTE — Progress Notes (Signed)
Patient ID: [name], female   DOB: Nov 03, 1955, 60 y.o.   MRN: [id number] Terri Stevenson was in for a weight check today.  She has been getting more vegetables; has a salad for lunch every day.  She has not yet branched out with new veg's, however.  She has not been counting kcal, but has instead been focused on eating good foods and following dietary recommendations, including getting protein with each meal. She has been working out an hour 5 X wk, anticipating knee replacement surgery tomorrow.    We reviewed the Urge911 process, which I encouraged Terri Stevenson to use at times when she is experiencing a lot of negative and self-critical thoughts, especially those urging her to restrict inappropriately.  Provided handout on Urge process, which includes link for instructional video.

## 2015-09-30 NOTE — Patient Instructions (Addendum)
-   Breakfast ideas for protein:  Yogurt, milk, cheese, cottage cheese, any kind of meat or poultry, nut butters, protein shakes.    - Look for a non-sweetened protein powder (i.e., John[employer] Killer Protein) to mix in milk (1 g protein per ounce), and add the amount of sugar/fruit you need to make it taste good.     - Goal: Reduce the amt of sugar over time, as your tastes change.   - Post-surgery:  Make a list of exercises you can do.  Consult your PT for ideas.  Write down a routine!  - Pull out your "Maybes" veg list, and work on incorporate more new veg's once you are at a point in rehab to do so.    - Watch the video link emailed today:  [id number] 30-minute Explanation: https://www.basementfamous.com  - Practice the [id number] at least once a day, and more, if appropriate.    - SCHEDULE FOLLOW-UP FOR 6/8 AT 11 am (60 MIN).

## 2015-10-01 ENCOUNTER — Inpatient Hospital Stay (HOSPITAL_COMMUNITY): Payer: Managed Care, Other (non HMO) | Admitting: Anesthesiology

## 2015-10-01 ENCOUNTER — Encounter (HOSPITAL_COMMUNITY)
Admission: RE | Disposition: A | Discharge status: Home Health | Payer: Self-pay | Source: Ambulatory Visit | Attending: Specialist

## 2015-10-01 ENCOUNTER — Encounter (HOSPITAL_COMMUNITY): Payer: Self-pay | Admitting: *Deleted

## 2015-10-01 ENCOUNTER — Inpatient Hospital Stay (HOSPITAL_COMMUNITY)
Admission: RE | Admit: 2015-10-01 | Discharge: 2015-10-03 | DRG: 470 | Disposition: A | Discharge status: Home Health | Payer: Managed Care, Other (non HMO) | Source: Ambulatory Visit | Attending: Specialist | Admitting: Specialist

## 2015-10-01 DIAGNOSIS — E669 Obesity, unspecified: Secondary | ICD-10-CM | POA: Diagnosis present

## 2015-10-01 DIAGNOSIS — Z6834 Body mass index (BMI) 34.0-34.9, adult: Secondary | ICD-10-CM

## 2015-10-01 DIAGNOSIS — K219 Gastro-esophageal reflux disease without esophagitis: Secondary | ICD-10-CM | POA: Diagnosis present

## 2015-10-01 DIAGNOSIS — I82812 Embolism and thrombosis of superficial veins of left lower extremities: Secondary | ICD-10-CM

## 2015-10-01 DIAGNOSIS — Z9884 Bariatric surgery status: Secondary | ICD-10-CM | POA: Diagnosis not present

## 2015-10-01 DIAGNOSIS — M25561 Pain in right knee: Secondary | ICD-10-CM | POA: Diagnosis present

## 2015-10-01 DIAGNOSIS — M1711 Unilateral primary osteoarthritis, right knee: Secondary | ICD-10-CM | POA: Diagnosis present

## 2015-10-01 DIAGNOSIS — Z96659 Presence of unspecified artificial knee joint: Secondary | ICD-10-CM

## 2015-10-01 HISTORY — PX: TOTAL KNEE ARTHROPLASTY: SHX125

## 2015-10-01 LAB — TYPE AND SCREEN
ABO/RH(D): A POS
ANTIBODY SCREEN: NEGATIVE

## 2015-10-01 SURGERY — ARTHROPLASTY, KNEE, TOTAL
Anesthesia: Monitor Anesthesia Care | Site: Knee | Laterality: Right

## 2015-10-01 MED ORDER — ACETAMINOPHEN 650 MG RE SUPP: 650.0000 mg | Freq: Four times a day (QID) | RECTAL | Status: DC | PRN

## 2015-10-01 MED ORDER — SODIUM CHLORIDE 0.9 % IJ SOLN
INTRAMUSCULAR | Status: AC
  Filled 2015-10-01: qty 50

## 2015-10-01 MED ORDER — FENTANYL CITRATE (PF) 100 MCG/2ML IJ SOLN
INTRAMUSCULAR | Status: AC
  Filled 2015-10-01: qty 2

## 2015-10-01 MED ORDER — HYDROMORPHONE HCL 1 MG/ML IJ SOLN
1.0000 mg | INTRAMUSCULAR | Status: DC | PRN
  Administered 2015-10-01 – 2015-10-02 (×4): 1 mg via INTRAVENOUS
  Filled 2015-10-01 (×4): qty 1

## 2015-10-01 MED ORDER — KETOROLAC TROMETHAMINE 30 MG/ML IJ SOLN
30.0000 mg | Freq: Once | INTRAMUSCULAR | Status: AC
  Administered 2015-10-01: 15 mg via INTRAVENOUS

## 2015-10-01 MED ORDER — HYDROMORPHONE HCL 1 MG/ML IJ SOLN
INTRAMUSCULAR | Status: AC
  Administered 2015-10-02: 1 mg via INTRAVENOUS
  Filled 2015-10-01: qty 1

## 2015-10-01 MED ORDER — CEFAZOLIN SODIUM-DEXTROSE 2-4 GM/100ML-% IV SOLN
2.0000 g | Freq: Four times a day (QID) | INTRAVENOUS | Status: DC
  Filled 2015-10-01: qty 100

## 2015-10-01 MED ORDER — KETOROLAC TROMETHAMINE 30 MG/ML IJ SOLN
INTRAMUSCULAR | Status: DC | PRN
  Administered 2015-10-01: 30 mg via INTRA_ARTICULAR

## 2015-10-01 MED ORDER — ACETAMINOPHEN 325 MG PO TABS: 650.0000 mg | ORAL_TABLET | Freq: Four times a day (QID) | ORAL | Status: DC | PRN

## 2015-10-01 MED ORDER — ASPIRIN EC 325 MG PO TBEC
325.0000 mg | DELAYED_RELEASE_TABLET | Freq: Two times a day (BID) | ORAL | Status: AC
Start: 2015-10-01 — End: ?

## 2015-10-01 MED ORDER — POTASSIUM CHLORIDE IN NACL 20-0.9 MEQ/L-% IV SOLN
INTRAVENOUS | Status: DC
  Administered 2015-10-02: 12:00:00 via INTRAVENOUS
  Filled 2015-10-01 (×4): qty 1000

## 2015-10-01 MED ORDER — FERROUS SULFATE 325 (65 FE) MG PO TABS
325.0000 mg | ORAL_TABLET | Freq: Every day | ORAL | Status: DC
  Administered 2015-10-02 – 2015-10-03 (×2): 325 mg via ORAL
  Filled 2015-10-01 (×2): qty 1

## 2015-10-01 MED ORDER — SODIUM CHLORIDE 0.9 % IR SOLN
Status: DC | PRN
  Administered 2015-10-01: 1000 mL

## 2015-10-01 MED ORDER — LACTATED RINGERS IV SOLN
INTRAVENOUS | Status: DC
  Administered 2015-10-01 (×2): via INTRAVENOUS

## 2015-10-01 MED ORDER — BUPIVACAINE HCL (PF) 0.75 % IJ SOLN
INTRAMUSCULAR | Status: DC | PRN
  Administered 2015-10-01: 15 mg via INTRATHECAL

## 2015-10-01 MED ORDER — CEFAZOLIN SODIUM-DEXTROSE 2-4 GM/100ML-% IV SOLN
2.0000 g | Freq: Four times a day (QID) | INTRAVENOUS | Status: AC
  Administered 2015-10-01 – 2015-10-02 (×2): 2 g via INTRAVENOUS
  Filled 2015-10-01 (×2): qty 100

## 2015-10-01 MED ORDER — BISACODYL 5 MG PO TBEC: 5.0000 mg | DELAYED_RELEASE_TABLET | Freq: Every day | ORAL | Status: DC | PRN

## 2015-10-01 MED ORDER — ACETAMINOPHEN 10 MG/ML IV SOLN
INTRAVENOUS | Status: AC
  Filled 2015-10-01: qty 100

## 2015-10-01 MED ORDER — ALUM & MAG HYDROXIDE-SIMETH 200-200-20 MG/5ML PO SUSP
30.0000 mL | ORAL | Status: DC | PRN
  Administered 2015-10-03: 30 mL via ORAL
  Filled 2015-10-01: qty 30

## 2015-10-01 MED ORDER — CALCIUM CARBONATE 1250 (500 CA) MG PO TABS
1.0000 | ORAL_TABLET | Freq: Two times a day (BID) | ORAL | Status: DC
  Administered 2015-10-02 – 2015-10-03 (×3): 500 mg via ORAL
  Filled 2015-10-01 (×3): qty 1

## 2015-10-01 MED ORDER — DEXAMETHASONE SODIUM PHOSPHATE 10 MG/ML IJ SOLN
10.0000 mg | Freq: Once | INTRAMUSCULAR | Status: AC
  Administered 2015-10-02: 10 mg via INTRAVENOUS
  Filled 2015-10-01: qty 1

## 2015-10-01 MED ORDER — VITAMIN B-12 1000 MCG PO TABS
500.0000 ug | ORAL_TABLET | Freq: Every day | ORAL | Status: DC
  Administered 2015-10-02 – 2015-10-03 (×2): 500 ug via ORAL
  Filled 2015-10-01 (×2): qty 1

## 2015-10-01 MED ORDER — METHOCARBAMOL 1000 MG/10ML IJ SOLN
500.0000 mg | Freq: Four times a day (QID) | INTRAVENOUS | Status: DC | PRN
  Administered 2015-10-01: 500 mg via INTRAVENOUS
  Filled 2015-10-01: qty 550
  Filled 2015-10-01: qty 5

## 2015-10-01 MED ORDER — NALTREXONE-BUPROPION HCL ER 8-90 MG PO TB12: 2.0000 | ORAL_TABLET | Freq: Two times a day (BID) | ORAL | Status: DC

## 2015-10-01 MED ORDER — POVIDONE-IODINE 7.5 % EX SOLN: Freq: Once | CUTANEOUS | Status: DC

## 2015-10-01 MED ORDER — CEFAZOLIN SODIUM-DEXTROSE 2-4 GM/100ML-% IV SOLN
2.0000 g | INTRAVENOUS | Status: AC
Start: 2015-10-01 — End: 2015-10-01
  Administered 2015-10-01: 2 g via INTRAVENOUS
  Filled 2015-10-01: qty 100

## 2015-10-01 MED ORDER — HYDROMORPHONE HCL 1 MG/ML IJ SOLN
INTRAMUSCULAR | Status: AC
  Filled 2015-10-01: qty 1

## 2015-10-01 MED ORDER — ADULT MULTIVITAMIN W/MINERALS CH
1.0000 | ORAL_TABLET | Freq: Every day | ORAL | Status: DC
  Filled 2015-10-01: qty 1

## 2015-10-01 MED ORDER — HYDROMORPHONE HCL 1 MG/ML IJ SOLN
0.2500 mg | INTRAMUSCULAR | Status: DC | PRN
  Administered 2015-10-01: 0.5 mg via INTRAVENOUS

## 2015-10-01 MED ORDER — FENTANYL CITRATE (PF) 100 MCG/2ML IJ SOLN
INTRAMUSCULAR | Status: DC | PRN
  Administered 2015-10-01: 50 ug via INTRAVENOUS
  Administered 2015-10-01: 100 ug via INTRAVENOUS
  Administered 2015-10-01: 50 ug via INTRAVENOUS
  Administered 2015-10-01: 100 ug via INTRAVENOUS

## 2015-10-01 MED ORDER — MENTHOL 3 MG MT LOZG: 1.0000 | LOZENGE | OROMUCOSAL | Status: DC | PRN

## 2015-10-01 MED ORDER — LACTINEX PO CHEW
1.0000 | CHEWABLE_TABLET | Freq: Every day | ORAL | Status: DC | PRN
  Filled 2015-10-01: qty 1

## 2015-10-01 MED ORDER — ONDANSETRON HCL 4 MG/2ML IJ SOLN
4.0000 mg | Freq: Four times a day (QID) | INTRAMUSCULAR | Status: DC | PRN
Start: 2015-10-01 — End: 2015-10-03

## 2015-10-01 MED ORDER — OXYCODONE-ACETAMINOPHEN 5-325 MG PO TABS: 1.0000 | ORAL_TABLET | ORAL | Status: DC | PRN

## 2015-10-01 MED ORDER — ONDANSETRON HCL 4 MG PO TABS
4.0000 mg | ORAL_TABLET | Freq: Four times a day (QID) | ORAL | Status: DC | PRN
  Filled 2015-10-01: qty 1

## 2015-10-01 MED ORDER — ONDANSETRON HCL 4 MG/2ML IJ SOLN
INTRAMUSCULAR | Status: DC | PRN
  Administered 2015-10-01: 4 mg via INTRAVENOUS

## 2015-10-01 MED ORDER — BUPIVACAINE-EPINEPHRINE 0.25% -1:200000 IJ SOLN
INTRAMUSCULAR | Status: DC | PRN
  Administered 2015-10-01: 30 mL

## 2015-10-01 MED ORDER — SODIUM CHLORIDE 0.9 % IJ SOLN
INTRAMUSCULAR | Status: DC | PRN
  Administered 2015-10-01: 30 mL

## 2015-10-01 MED ORDER — PROPOFOL 10 MG/ML IV BOLUS
INTRAVENOUS | Status: AC
  Filled 2015-10-01: qty 40

## 2015-10-01 MED ORDER — MEPERIDINE HCL 50 MG/ML IJ SOLN: 6.2500 mg | INTRAMUSCULAR | Status: DC | PRN

## 2015-10-01 MED ORDER — DEXAMETHASONE SODIUM PHOSPHATE 10 MG/ML IJ SOLN
10.0000 mg | Freq: Once | INTRAMUSCULAR | Status: AC
  Administered 2015-10-01: 10 mg via INTRAVENOUS

## 2015-10-01 MED ORDER — PROCHLORPERAZINE EDISYLATE 5 MG/ML IJ SOLN: 10.0000 mg | INTRAMUSCULAR | Status: DC | PRN

## 2015-10-01 MED ORDER — CEFAZOLIN SODIUM-DEXTROSE 2-4 GM/100ML-% IV SOLN
INTRAVENOUS | Status: AC
  Filled 2015-10-01: qty 100

## 2015-10-01 MED ORDER — KETOROLAC TROMETHAMINE 30 MG/ML IJ SOLN
INTRAMUSCULAR | Status: AC
  Filled 2015-10-01: qty 1

## 2015-10-01 MED ORDER — PHENOL 1.4 % MT LIQD
1.0000 | OROMUCOSAL | Status: DC | PRN
Start: 2015-10-01 — End: 2015-10-03
  Filled 2015-10-01: qty 177

## 2015-10-01 MED ORDER — LACTATED RINGERS IV SOLN
INTRAVENOUS | Status: DC
  Administered 2015-10-01: 17:00:00 via INTRAVENOUS

## 2015-10-01 MED ORDER — METOCLOPRAMIDE HCL 5 MG PO TABS: 5.0000 mg | ORAL_TABLET | Freq: Three times a day (TID) | ORAL | Status: DC | PRN

## 2015-10-01 MED ORDER — ENOXAPARIN SODIUM 30 MG/0.3ML ~~LOC~~ SOLN
30.0000 mg | Freq: Two times a day (BID) | SUBCUTANEOUS | Status: DC
  Administered 2015-10-02 – 2015-10-03 (×3): 30 mg via SUBCUTANEOUS
  Filled 2015-10-01 (×3): qty 0.3

## 2015-10-01 MED ORDER — OXYCODONE HCL 5 MG PO TABS
5.0000 mg | ORAL_TABLET | ORAL | Status: DC | PRN
  Administered 2015-10-01 – 2015-10-03 (×11): 10 mg via ORAL
  Filled 2015-10-01 (×11): qty 2

## 2015-10-01 MED ORDER — MAGNESIUM CITRATE PO SOLN: 1.0000 | Freq: Once | ORAL | Status: DC | PRN

## 2015-10-01 MED ORDER — ZOLPIDEM TARTRATE 5 MG PO TABS
5.0000 mg | ORAL_TABLET | Freq: Every evening | ORAL | Status: DC | PRN
  Filled 2015-10-01: qty 1

## 2015-10-01 MED ORDER — PROPOFOL 500 MG/50ML IV EMUL
INTRAVENOUS | Status: DC | PRN
  Administered 2015-10-01: 100 ug/kg/min via INTRAVENOUS

## 2015-10-01 MED ORDER — POLYETHYLENE GLYCOL 3350 17 G PO PACK: 17.0000 g | PACK | Freq: Every day | ORAL | Status: DC | PRN

## 2015-10-01 MED ORDER — BACID PO TABS
1.0000 | ORAL_TABLET | Freq: Every day | ORAL | Status: DC | PRN
  Filled 2015-10-01: qty 1

## 2015-10-01 MED ORDER — DOCUSATE SODIUM 100 MG PO CAPS
100.0000 mg | ORAL_CAPSULE | Freq: Two times a day (BID) | ORAL | Status: DC
  Administered 2015-10-01 – 2015-10-03 (×4): 100 mg via ORAL
  Filled 2015-10-01 (×4): qty 1

## 2015-10-01 MED ORDER — BUPIVACAINE-EPINEPHRINE (PF) 0.25% -1:200000 IJ SOLN
INTRAMUSCULAR | Status: AC
  Filled 2015-10-01: qty 30

## 2015-10-01 MED ORDER — MIDAZOLAM HCL 2 MG/2ML IJ SOLN
INTRAMUSCULAR | Status: AC
  Filled 2015-10-01: qty 2

## 2015-10-01 MED ORDER — ACETAMINOPHEN 10 MG/ML IV SOLN
1000.0000 mg | Freq: Once | INTRAVENOUS | Status: AC
  Administered 2015-10-01: 1000 mg via INTRAVENOUS

## 2015-10-01 MED ORDER — DIPHENHYDRAMINE HCL 12.5 MG/5ML PO ELIX: 12.5000 mg | ORAL_SOLUTION | ORAL | Status: DC | PRN

## 2015-10-01 MED ORDER — METHOCARBAMOL 500 MG PO TABS: 500.0000 mg | ORAL_TABLET | Freq: Three times a day (TID) | ORAL | Status: AC | PRN

## 2015-10-01 MED ORDER — MIDAZOLAM HCL 5 MG/5ML IJ SOLN
INTRAMUSCULAR | Status: DC | PRN
  Administered 2015-10-01 (×2): 2 mg via INTRAVENOUS

## 2015-10-01 MED ORDER — METHOCARBAMOL 500 MG PO TABS
500.0000 mg | ORAL_TABLET | Freq: Four times a day (QID) | ORAL | Status: DC | PRN
  Administered 2015-10-02 – 2015-10-03 (×6): 500 mg via ORAL
  Filled 2015-10-01 (×6): qty 1

## 2015-10-01 MED ORDER — HYDROMORPHONE HCL 1 MG/ML IJ SOLN
0.2500 mg | INTRAMUSCULAR | Status: DC | PRN
  Administered 2015-10-01 (×5): 0.5 mg via INTRAVENOUS

## 2015-10-01 MED ORDER — MEDROXYPROGESTERONE ACETATE 10 MG PO TABS: 10.0000 mg | ORAL_TABLET | ORAL | Status: DC

## 2015-10-01 MED ORDER — METOCLOPRAMIDE HCL 5 MG/ML IJ SOLN: 5.0000 mg | Freq: Three times a day (TID) | INTRAMUSCULAR | Status: DC | PRN

## 2015-10-01 SURGICAL SUPPLY — 57 items
BAG DECANTER FOR FLEXI CONT (MISCELLANEOUS) IMPLANT
BAG ZIPLOCK 12X15 (MISCELLANEOUS) ×3 IMPLANT
BANDAGE ACE 4X5 VEL STRL LF (GAUZE/BANDAGES/DRESSINGS) ×3 IMPLANT
BANDAGE ACE 6X5 VEL STRL LF (GAUZE/BANDAGES/DRESSINGS) ×3 IMPLANT
BLADE SAG 18X100X1.27 (BLADE) ×3 IMPLANT
BLADE SAW SGTL 13.0X1.19X90.0M (BLADE) ×3 IMPLANT
CAP KNEE TOTAL 3 SIGMA ×3 IMPLANT
CEMENT HV SMART SET (Cement) ×6 IMPLANT
CLOTH BEACON ORANGE TIMEOUT ST (SAFETY) ×3 IMPLANT
CUFF TOURN SGL QUICK 34 (TOURNIQUET CUFF) ×2
CUFF TRNQT CYL 34X4X40X1 (TOURNIQUET CUFF) ×1 IMPLANT
DECANTER SPIKE VIAL GLASS SM (MISCELLANEOUS) ×3 IMPLANT
DRAPE U-SHAPE 47X51 STRL (DRAPES) ×3 IMPLANT
DRSG AQUACEL AG ADV 3.5X10 (GAUZE/BANDAGES/DRESSINGS) ×3 IMPLANT
DRSG TEGADERM 4X4.75 (GAUZE/BANDAGES/DRESSINGS) ×3 IMPLANT
DURAPREP 26ML APPLICATOR (WOUND CARE) ×6 IMPLANT
ELECT REM PT RETURN 9FT ADLT (ELECTROSURGICAL) ×3
ELECTRODE REM PT RTRN 9FT ADLT (ELECTROSURGICAL) ×1 IMPLANT
EVACUATOR 1/8 PVC DRAIN (DRAIN) ×3 IMPLANT
GAUZE SPONGE 2X2 8PLY STRL LF (GAUZE/BANDAGES/DRESSINGS) ×1 IMPLANT
GLOVE BIOGEL PI IND STRL 8 (GLOVE) ×2 IMPLANT
GLOVE BIOGEL PI INDICATOR 8 (GLOVE) ×4
GLOVE SURG ORTHO 8.0 STRL STRW (GLOVE) ×3 IMPLANT
GLOVE SURG ORTHO 9.0 STRL STRW (GLOVE) ×3 IMPLANT
GLOVE SURG SS PI 7.5 STRL IVOR (GLOVE) ×3 IMPLANT
GOWN STRL REUS W/TWL XL LVL3 (GOWN DISPOSABLE) ×6 IMPLANT
HANDPIECE INTERPULSE COAX TIP (DISPOSABLE) ×2
IMMOBILIZER KNEE 20 (SOFTGOODS) ×3
IMMOBILIZER KNEE 20 THIGH 36 (SOFTGOODS) ×1 IMPLANT
LIQUID BAND (GAUZE/BANDAGES/DRESSINGS) ×3 IMPLANT
NS IRRIG 1000ML POUR BTL (IV SOLUTION) ×3 IMPLANT
PACK TOTAL KNEE CUSTOM (KITS) ×3 IMPLANT
POSITIONER SURGICAL ARM (MISCELLANEOUS) ×3 IMPLANT
SET HNDPC FAN SPRY TIP SCT (DISPOSABLE) ×1 IMPLANT
SET PAD KNEE POSITIONER (MISCELLANEOUS) ×3 IMPLANT
SPONGE GAUZE 2X2 STER 10/PKG (GAUZE/BANDAGES/DRESSINGS) ×2
SPONGE LAP 18X18 X RAY DECT (DISPOSABLE) ×3 IMPLANT
SPONGE SURGIFOAM ABS GEL 100 (HEMOSTASIS) ×3 IMPLANT
STOCKINETTE 6  STRL (DRAPES) ×2
STOCKINETTE 6 STRL (DRAPES) ×1 IMPLANT
SUCTION FRAZIER HANDLE 12FR (TUBING)
SUCTION TUBE FRAZIER 12FR DISP (TUBING) IMPLANT
SUT BONE WAX W31G (SUTURE) ×3 IMPLANT
SUT MNCRL AB 3-0 PS2 18 (SUTURE) ×3 IMPLANT
SUT VIC AB 1 CT1 27 (SUTURE) ×8
SUT VIC AB 1 CT1 27XBRD ANTBC (SUTURE) ×4 IMPLANT
SUT VIC AB 2-0 CT1 27 (SUTURE) ×4
SUT VIC AB 2-0 CT1 TAPERPNT 27 (SUTURE) ×2 IMPLANT
SUT VLOC 180 0 24IN GS25 (SUTURE) ×3 IMPLANT
SYR 50ML LL SCALE MARK (SYRINGE) ×3 IMPLANT
TAPE STRIPS DRAPE STRL (GAUZE/BANDAGES/DRESSINGS) ×3 IMPLANT
TOWER CARTRIDGE SMART MIX (DISPOSABLE) ×3 IMPLANT
TRAY FOLEY W/METER SILVER 14FR (SET/KITS/TRAYS/PACK) ×3 IMPLANT
TRAY FOLEY W/METER SILVER 16FR (SET/KITS/TRAYS/PACK) IMPLANT
WATER STERILE IRR 1500ML POUR (IV SOLUTION) ×6 IMPLANT
WRAP KNEE MAXI GEL POST OP (GAUZE/BANDAGES/DRESSINGS) ×3 IMPLANT
YANKAUER SUCT BULB TIP 10FT TU (MISCELLANEOUS) ×3 IMPLANT

## 2015-10-01 NOTE — Anesthesia Procedure Notes (Signed)
Spinal Patient location during procedure: OR Start time: 10/01/2015 2:18 PM End time: 10/01/2015 2:20 PM Staffing Resident/CRNA: Harle Stanford R Performed by: resident/CRNA  Preanesthetic Checklist Completed: patient identified, site marked, surgical consent, pre-op evaluation, timeout performed, IV checked, risks and benefits discussed and monitors and equipment checked Spinal Block Patient position: sitting Prep: Betadine Location: L3-4 Injection technique: single-shot Needle Needle type: Sprotte  Needle gauge: 24 G Needle length: 10 cm Needle insertion depth: 7 cm Assessment Sensory level: T6 Additional Notes Timeout performed. Spinal kit date checked. SAB without difficulty

## 2015-10-01 NOTE — Interval H&P Note (Signed)
History and Physical Interval Note:  10/01/2015 2:12 PM  Terri Stevenson  has presented today for surgery, with the diagnosis of RIGHT KNEE OSTEOARTHRITIS  The various methods of treatment have been discussed with the patient and family. After consideration of risks, benefits and other options for treatment, the patient has consented to  Procedure(s): RIGHT TOTAL KNEE ARTHROPLASTY (Right) as a surgical intervention .  The patient's history has been reviewed, patient examined, no change in status, stable for surgery.  I have reviewed the patient's chart and labs.  Questions were answered to the patient's satisfaction.     [name] ANDREW

## 2015-10-01 NOTE — Anesthesia Preprocedure Evaluation (Signed)
Anesthesia Evaluation  Patient identified by MRN, date of birth, ID band Patient awake    Reviewed: Allergy & Precautions, NPO status , Patient's Chart, lab work & pertinent test results  Airway Mallampati: II  TM Distance: >3 FB Neck ROM: Full    Dental  (+) Teeth Intact, Dental Advisory Given   Pulmonary    breath sounds clear to auscultation       Cardiovascular  Rhythm:Regular Rate:Normal     Neuro/Psych    GI/Hepatic   Endo/Other    Renal/GU      Musculoskeletal   Abdominal   Peds  Hematology   Anesthesia Other Findings   Reproductive/Obstetrics                             Anesthesia Physical Anesthesia Plan  ASA: II  Anesthesia Plan: MAC and Spinal   Post-op Pain Management:    Induction: Intravenous  Airway Management Planned: Natural Airway and Simple Face Mask  Additional Equipment:   Intra-op Plan:   Post-operative Plan:   Informed Consent: I have reviewed the patients History and Physical, chart, labs and discussed the procedure including the risks, benefits and alternatives for the proposed anesthesia with the patient or authorized representative who has indicated his/her understanding and acceptance.   Dental advisory given  Plan Discussed with: CRNA and Anesthesiologist  Anesthesia Plan Comments: (DJD R. Knee Obesity S/P gastric bypass  H/O DVT  Plan MAC  Roberts Gaudy)        Anesthesia Quick Evaluation

## 2015-10-01 NOTE — H&P (View-Only) (Signed)
Patient ID: Terri Stevenson, female   DOB: Apr 04, 1956, 60 y.o.   MRN: [id number] [name] was in for a weight check today.  She has been getting more vegetables; has a salad for lunch every day.  She has not yet branched out with new veg's, however.  She has not been counting kcal, but has instead been focused on eating good foods and following dietary recommendations, including getting protein with each meal. She has been working out an hour 5 X wk, anticipating knee replacement surgery tomorrow.    We reviewed the Urge911 process, which I encouraged [name] to use at times when she is experiencing a lot of negative and self-critical thoughts, especially those urging her to restrict inappropriately.  Provided handout on Urge process, which includes link for instructional video.

## 2015-10-01 NOTE — Anesthesia Postprocedure Evaluation (Signed)
Anesthesia Post Note  Patient: Terri Stevenson  Procedure(s) Performed: Procedure(s) (LRB): RIGHT TOTAL KNEE ARTHROPLASTY (Right)  Patient location during evaluation: PACU Anesthesia Type: Spinal Level of consciousness: oriented and awake and alert Pain management: pain level controlled Vital Signs Assessment: post-procedure vital signs reviewed and stable Respiratory status: spontaneous breathing, respiratory function stable and patient connected to nasal cannula oxygen Cardiovascular status: blood pressure returned to baseline and stable Postop Assessment: no headache, no backache and spinal receding Anesthetic complications: no    Last Vitals:  Filed Vitals:   10/01/15 1730 10/01/15 1745  BP: 121/68 114/66  Pulse: 62 61  Temp:    Resp: 12 16    Last Pain:  Filed Vitals:   10/01/15 1747  PainSc: 10-Worst pain ever                 Effie Berkshire

## 2015-10-01 NOTE — Op Note (Signed)
DATE OF SURGERY:  10/01/2015  TIME: 4:09 PM  PATIENT NAME:  Terri Stevenson    AGE: 60 y.o.   PRE-OPERATIVE DIAGNOSIS:  RIGHT KNEE OSTEOARTHRITIS  POST-OPERATIVE DIAGNOSIS:  RIGHT KNEE OSTEOARTHRITIS  PROCEDURE:  Procedure(s): RIGHT TOTAL KNEE ARTHROPLASTY  SURGEON:  Tammie Yanda ANDREW  ASSISTANT:  Bryson Stilwell, PA-C, present and scrubbed throughout the case, critical for assistance with exposure, retraction, instrumentation, and closure.  OPERATIVE IMPLANTS: Depuy PFC Sigma Rotating Platform.  Femur size 3, Tibia size 3, Patella size 35 3-peg oval button, with a 12/5 mm polyethylene insert.   PREOPERATIVE INDICATIONS:   Terri Stevenson is a 60 y.o. year old female with end stage bone on bone arthritis of the knee who failed conservative treatment and elected for Total Knee Arthroplasty.   The risks, benefits, and alternatives were discussed at length including but not limited to the risks of infection, bleeding, nerve injury, stiffness, blood clots, the need for revision surgery, cardiopulmonary complications, among others, and they were willing to proceed.  OPERATIVE DESCRIPTION:  The patient was brought to the operative room and placed in a supine position.  Spinal anesthesia was administered.  IV antibiotics were given.  The lower extremity was prepped and draped in the usual sterile fashion.  Time out was performed.  The leg was elevated and exsanguinated and the tourniquet was inflated.  Anterior quadriceps tendon splitting approach was performed.  The patella was retracted and osteophytes were removed.  The anterior horn of the medial and lateral meniscus was removed and cruciate ligaments resected.   The distal femur was opened with the drill and the intramedullary distal femoral cutting jig was utilized, set at 5 degrees resecting 10 mm off the distal femur.  Care was taken to protect the collateral ligaments.  The distal femoral sizing jig was applied, taking care to  avoid notching.  Then the 4-in-1 cutting jig was applied and the anterior and posterior femur was cut, along with the chamfer cuts.    Then the extramedullary tibial cutting jig was utilized making the appropriate cut using the anterior tibial crest as a reference building in appropriate posterior slope.  Care was taken during the cut to protect the medial and collateral ligaments.  The proximal tibia was removed along with the posterior horns of the menisci.   The posterior medial femoral osteophytes and posterior lateral femoral osteophytes were removed.    The flexion gap was then measured and was symmetric with the extension gap, measured at 12.  I completed the distal femoral preparation using the appropriate jig to prepare the box.  The patella was then measured, and cut with the saw.    The proximal tibia sized and prepared accordingly with the reamer and the punch, and then all components were trialed with the trial insert.  The knee was found to have excellent balance and full motion.    The above named components were then cemented into place and all excess cement was removed.  The trial polyethylene component was in place during cementation, and then was exchanged for the real polyethylene component.    The knee was easily taken through a range of motion and the patella tracked well and the knee irrigated copiously and the parapatellar and subcutaneous tissue closed with vicryl, and monocryl with steri strips for the skin.  The arthrotomy was closed at 90 of flexion. The wounds were dressed with sterile gauze and the tourniquet released and the patient was awakened and returned to the PACU in stable  and satisfactory condition.  There were no complications.  Total tourniquet time was 90 minutes.

## 2015-10-01 NOTE — Transfer of Care (Signed)
Immediate Anesthesia Transfer of Care Note  Patient: Terri Stevenson  Procedure(s) Performed: Procedure(s): RIGHT TOTAL KNEE ARTHROPLASTY (Right)  Patient Location: PACU  Anesthesia Type:Spinal  Level of Consciousness: sedated  Airway & Oxygen Therapy: Patient Spontanous Breathing and Patient connected to face mask oxygen  Post-op Assessment: Report given to RN and Post -op Vital signs reviewed and stable  Post vital signs: Reviewed and stable  Last Vitals:  Filed Vitals:   10/01/15 1058  BP: 145/71  Pulse: 80  Temp: 36.7 C  Resp: 16    Last Pain: There were no vitals filed for this visit.       Complications: No apparent anesthesia complications

## 2015-10-02 LAB — BASIC METABOLIC PANEL
ANION GAP: 8 (ref 5–15)
BUN: 11 mg/dL (ref 6–20)
CHLORIDE: 106 mmol/L (ref 101–111)
CO2: 27 mmol/L (ref 22–32)
Calcium: 8.6 mg/dL — ABNORMAL LOW (ref 8.9–10.3)
Creatinine, Ser: 0.96 mg/dL (ref 0.44–1.00)
Glucose, Bld: 128 mg/dL — ABNORMAL HIGH (ref 65–99)
POTASSIUM: 4.5 mmol/L (ref 3.5–5.1)
Sodium: 141 mmol/L (ref 135–145)

## 2015-10-02 LAB — CBC
HEMATOCRIT: 36.6 % (ref 36.0–46.0)
HEMOGLOBIN: 11.9 g/dL — AB (ref 12.0–15.0)
MCH: 29.2 pg (ref 26.0–34.0)
MCHC: 32.5 g/dL (ref 30.0–36.0)
MCV: 89.9 fL (ref 78.0–100.0)
Platelets: 240 10*3/uL (ref 150–400)
RBC: 4.07 MIL/uL (ref 3.87–5.11)
RDW: 14.3 % (ref 11.5–15.5)
WBC: 9.8 10*3/uL (ref 4.0–10.5)

## 2015-10-02 MED ORDER — ADULT MULTIVITAMIN W/MINERALS CH
1.0000 | ORAL_TABLET | Freq: Every day | ORAL | Status: DC
  Administered 2015-10-02 – 2015-10-03 (×2): 1 via ORAL
  Filled 2015-10-02 (×2): qty 1

## 2015-10-02 NOTE — Progress Notes (Signed)
OT Cancellation Note  Patient Details Name: Georgi Hakimian MRN: [id number] DOB: 1955/12/08   Cancelled Treatment:    Reason Eval/Treat Not Completed: Pain limiting ability to participate. Will check back.  Cori Justus 10/02/2015, 10:55 AM  Lesle Chris, OTR/L [phone] 10/02/2015

## 2015-10-02 NOTE — Progress Notes (Signed)
Pt has recommended HHPT at d/c. RNCM will asist with d/c planning. CSW will sign off at this time.  Werner Lean LCSW [phone]

## 2015-10-02 NOTE — Evaluation (Signed)
Physical Therapy Evaluation Patient Details Name: Terri Stevenson MRN: [id number] DOB: 1955-09-12 Today's Date: 10/02/2015   History of Present Illness  [name]  Clinical Impression  Pt s/p [name] presents with decreased R LE strength/ROM and post op pain limiting functional mobility.  Pt should progress to dc home with family assist and HHPT follow up.    Follow Up Recommendations Home health PT    Equipment Recommendations  Rolling walker with 5" wheels    Recommendations for Other Services OT consult     Precautions / Restrictions Precautions Precautions: Knee;Fall Required Braces or Orthoses: Knee Immobilizer - Right Knee Immobilizer - Right: Discontinue once straight leg raise with < 10 degree lag Restrictions Weight Bearing Restrictions: No Other Position/Activity Restrictions: WBAT      Mobility  Bed Mobility Overal bed mobility: Needs Assistance Bed Mobility: Supine to Sit     Supine to sit: Min assist;Mod assist     General bed mobility comments: cues for sequence and use of L LE to self assist  Transfers Overall transfer level: Needs assistance Equipment used: Rolling walker (2 wheeled) Transfers: Sit to/from Stand Sit to Stand: Min assist;Mod assist         General transfer comment: cues for LE management and use of UEs to self assist  Ambulation/Gait Ambulation/Gait assistance: Min assist;+2 safety/equipment Ambulation Distance (Feet): 12 Feet Assistive device: Rolling walker (2 wheeled) Gait Pattern/deviations: Step-to pattern;Decreased step length - left;Shuffle;Decreased stance time - right Gait velocity: decr   General Gait Details: cues for sequence, posture, stride length and position from RW.  LTD by onset of dizziness  Stairs            Wheelchair Mobility    Modified Rankin (Stroke Patients Only)       Balance                                             Pertinent Vitals/Pain Pain Assessment: 0-10 Pain  Score: 6  Pain Location: R knee Pain Descriptors / Indicators: Aching;Sore Pain Intervention(s): Limited activity within patient's tolerance;Monitored during session;Premedicated before session;Ice applied    Home Living Family/patient expects to be discharged to:: Private residence Living Arrangements: Spouse/significant other Available Help at Discharge: Family Type of Home: House Home Access: Stairs to enter Entrance Stairs-Rails: Psychiatric nurse of Steps: 7 Home Layout: Able to live on main level with bedroom/bathroom Home Equipment: Crutches      Prior Function Level of Independence: Independent               Hand Dominance        Extremity/Trunk Assessment   Upper Extremity Assessment: Overall WFL for tasks assessed           Lower Extremity Assessment: RLE deficits/detail RLE Deficits / Details: 2/5 quads strength with AAROM at knee -10-  30 with ++ muscle guarding    Cervical / Trunk Assessment: Normal  Communication   Communication: No difficulties  Cognition Arousal/Alertness: Awake/alert Behavior During Therapy: WFL for tasks assessed/performed Overall Cognitive Status: Within Functional Limits for tasks assessed                      General Comments      Exercises Total Joint Exercises Ankle Circles/Pumps: AROM;Both;10 reps;Supine Quad Sets: AROM;Both;10 reps;Supine Heel Slides: AAROM;Right;10 reps;Supine Straight Leg Raises: AAROM;Right;10 reps;Supine  Assessment/Plan    PT Assessment Patient needs continued PT services  PT Diagnosis Difficulty walking   PT Problem List Decreased strength;Decreased range of motion;Decreased activity tolerance;Decreased mobility;Decreased knowledge of use of DME;Pain;Obesity  PT Treatment Interventions DME instruction;Gait training;Stair training;Functional mobility training;Therapeutic activities;Therapeutic exercise;Patient/family education   PT Goals (Current goals can  be found in the Care Plan section) Acute Rehab PT Goals Patient Stated Goal: Never have this done again PT Goal Formulation: With patient Time For Goal Achievement: 10/07/15 Potential to Achieve Goals: Good    Frequency 7X/week   Barriers to discharge        Co-evaluation               End of Session Equipment Utilized During Treatment: Right knee immobilizer Activity Tolerance: Other (comment) (dizziness) Patient left: in chair;with call bell/phone within reach;with family/visitor present Nurse Communication: Mobility status         Time: [id number] PT Time Calculation (min) (ACUTE ONLY): 32 min   Charges:     PT Treatments $Therapeutic Exercise: 8-22 mins   PT G Codes:        Nima Kemppainen 2015-10-30, 1:08 PM

## 2015-10-02 NOTE — Progress Notes (Signed)
Physical Therapy Treatment Patient Details Name: Terri Stevenson MRN: [id number] DOB: 06-27-1955 Today's Date: 10/02/2015    History of Present Illness [name]    PT Comments    Marked improvement in activity tolerance and pain control.  Follow Up Recommendations  Home health PT     Equipment Recommendations  Rolling walker with 5" wheels    Recommendations for Other Services OT consult     Precautions / Restrictions Precautions Precautions: Knee;Fall Required Braces or Orthoses: Knee Immobilizer - Right Knee Immobilizer - Right: Discontinue once straight leg raise with < 10 degree lag Restrictions Weight Bearing Restrictions: No Other Position/Activity Restrictions: WBAT    Mobility  Bed Mobility Overal bed mobility: Needs Assistance Bed Mobility: Sit to Supine     Supine to sit: Min assist Sit to supine: Min assist   General bed mobility comments: assist for LLE  Transfers Overall transfer level: Needs assistance Equipment used: Rolling walker (2 wheeled) Transfers: Sit to/from Stand Sit to Stand: Min assist;Mod assist         General transfer comment: cues for LE management and use of UEs for safe and controlled landing  Ambulation/Gait Ambulation/Gait assistance: Min assist;Min guard Ambulation Distance (Feet): 100 Feet Assistive device: Rolling walker (2 wheeled) Gait Pattern/deviations: Step-to pattern;Decreased step length - right;Decreased step length - left;Shuffle;Trunk flexed Gait velocity: decr   General Gait Details: cues for sequence, posture, stride length and position from RW.     Stairs            Wheelchair Mobility    Modified Rankin (Stroke Patients Only)       Balance                                    Cognition Arousal/Alertness: Awake/alert Behavior During Therapy: WFL for tasks assessed/performed Overall Cognitive Status: Within Functional Limits for tasks assessed                       Exercises      General Comments        Pertinent Vitals/Pain Pain Assessment: 0-10 Pain Score: 4  Pain Location: R knee Pain Descriptors / Indicators: Aching;Sore Pain Intervention(s): Limited activity within patient's tolerance;Monitored during session;Premedicated before session;Ice applied    Home Living Family/patient expects to be discharged to:: Private residence Living Arrangements: Spouse/significant other Available Help at Discharge: Family         Home Equipment: Crutches      Prior Function Level of Independence: Independent          PT Goals (current goals can now be found in the care plan section) Acute Rehab PT Goals Patient Stated Goal: Never have this done again PT Goal Formulation: With patient Time For Goal Achievement: 10/07/15 Potential to Achieve Goals: Good Progress towards PT goals: Progressing toward goals    Frequency  7X/week    PT Plan Current plan remains appropriate    Co-evaluation             End of Session Equipment Utilized During Treatment: Right knee immobilizer Activity Tolerance: Patient tolerated treatment well Patient left: in bed;with call bell/phone within reach     Time: [id number] PT Time Calculation (min) (ACUTE ONLY): 15 min  Charges:  $Gait Training: 8-22 mins                    G Codes:  Terri Stevenson 10/02/2015, 3:34 PM

## 2015-10-02 NOTE — Progress Notes (Signed)
CSW consulted for SNF placement. Awaiting Pt / OT recommendations.  Werner Lean LCSW [phone]

## 2015-10-02 NOTE — Progress Notes (Signed)
   Subjective:  Patient reports pain as mild to moderate.  Pain control issues o/n - better now. Denies N/V/CP/SOB.  Objective:   VITALS:   Filed Vitals:   10/01/15 1816 10/01/15 1917 10/01/15 2147 10/02/15 0356  BP: 121/62 122/71 104/53 117/59  Pulse: 70 73 76 68  Temp: 99 F (37.2 C) 98.2 F (36.8 C) 98 F (36.7 C) 98.5 F (36.9 C)  TempSrc: Oral Oral Oral Oral  Resp: 16 15  16   Height: 5\' 4"  (1.626 m)     Weight: 95.709 kg (211 lb)     SpO2: 100% 98% 96% 96%    ABD soft Sensation intact distally Intact pulses distally Dorsiflexion/Plantar flexion intact Incision: dressing C/D/I Compartment soft HV ss  Lab Results  Component Value Date   WBC 9.8 10/02/2015   HGB 11.9* 10/02/2015   HCT 36.6 10/02/2015   MCV 89.9 10/02/2015   PLT 240 10/02/2015   BMET    Component Value Date/Time   NA 141 10/02/2015 0354   K 4.5 10/02/2015 0354   CL 106 10/02/2015 0354   CO2 27 10/02/2015 0354   GLUCOSE 128* 10/02/2015 0354   BUN 11 10/02/2015 0354   CREATININE 0.96 10/02/2015 0354   CALCIUM 8.6* 10/02/2015 0354   GFRNONAA >60 10/02/2015 0354   GFRAA >60 10/02/2015 0354     Assessment/Plan: 1 Day Post-Op   Active Problems:   Osteoarthritis of right knee   S/P knee replacement   Advance diet Up with therapy Plan for discharge tomorrow D/C HV drain   Cheree Fowles, Horald Pollen 10/02/2015, 7:43 AM   Rod Can, MD Cell [phone]

## 2015-10-02 NOTE — Evaluation (Signed)
Occupational Therapy Evaluation Patient Details Name: Terri Stevenson MRN: [id number] DOB: Oct 13, 1955 Today's Date: 10/02/2015    History of Present Illness [name]   Clinical Impression   This 60 year old female was admitted for the above sx.  She will benefit from continued OT in acute setting to increase independence with bathroom transfers.  Pt's shower is upstairs; will educate her on transfer next visit and practice this if she desires to.  Husband will assist with adls at home    Follow Up Recommendations  Supervision/Assistance - 24 hour    Equipment Recommendations  3 in 1 bedside comode    Recommendations for Other Services       Precautions / Restrictions Precautions Precautions: Knee;Fall Required Braces or Orthoses: Knee Immobilizer - Right Knee Immobilizer - Right: Discontinue once straight leg raise with < 10 degree lag Restrictions Other Position/Activity Restrictions: WBAT      Mobility Bed Mobility         Supine to sit: Min assist     General bed mobility comments: assist for LLE  Transfers   Equipment used: Rolling walker (2 wheeled) Transfers: Sit to/from Stand Sit to Stand: Mod assist         General transfer comment: assist to rise and stabilize; slow transition.  Pt's IV had infiltrated so pushed up with RUE. Cues for UE/LE placement    Balance                                            ADL Overall ADL's : Needs assistance/impaired     Grooming: Wash/dry hands;Supervision/safety;Standing   Upper Body Bathing: Set up;Sitting   Lower Body Bathing: Moderate assistance;Sit to/from stand   Upper Body Dressing : Set up;Sitting   Lower Body Dressing: Moderate assistance;Sit to/from stand   Toilet Transfer: Moderate assistance;Ambulation;Comfort height toilet (without rails)   Toileting- Clothing Manipulation and Hygiene: Min guard;Sit to/from stand (once standing)         General ADL Comments: Pt will have  husband assist her at home:  not interested in AE.  Pt needs mod A for sit to stand; transitions are slow. She has a standard commode at home without anything to push from:  feel she would benefit from 3:1.  Shower is up on second floor     Vision     Perception     Praxis      Pertinent Vitals/Pain Pain Score: 4  Pain Location: R knee Pain Descriptors / Indicators: Aching Pain Intervention(s): Limited activity within patient's tolerance;Monitored during session;Premedicated before session;Repositioned     Hand Dominance     Extremity/Trunk Assessment Upper Extremity Assessment Upper Extremity Assessment: Overall WFL for tasks assessed           Communication Communication Communication: No difficulties   Cognition Arousal/Alertness: Awake/alert Behavior During Therapy: WFL for tasks assessed/performed Overall Cognitive Status: Within Functional Limits for tasks assessed                     General Comments       Exercises       Shoulder Instructions      Home Living Family/patient expects to be discharged to:: Private residence Living Arrangements: Spouse/significant other Available Help at Discharge: Family               Bathroom Shower/Tub: Walk-in shower (second floor)  Bathroom Toilet: Standard (nothing to push up from)     Home Equipment: Crutches          Prior Functioning/Environment Level of Independence: Independent             OT Diagnosis: Acute pain   OT Problem List: Decreased strength;Decreased activity tolerance;Decreased knowledge of use of DME or AE;Pain   OT Treatment/Interventions: Self-care/ADL training;DME and/or AE instruction;Patient/family education    OT Goals(Current goals can be found in the care plan section) Acute Rehab OT Goals Patient Stated Goal: Never have this done again OT Goal Formulation: With patient Time For Goal Achievement: 10/09/15 Potential to Achieve Goals: Good ADL Goals Pt Will  Transfer to Toilet: with min assist;ambulating;bedside commode Pt Will Perform Tub/Shower Transfer: Shower transfer;ambulating;3 in 1;with min assist (vs verbalize)  OT Frequency: Min 2X/week   Barriers to D/C:            Co-evaluation              End of Session    Activity Tolerance: Patient tolerated treatment well Patient left:  (handed off to PT)   Time: 1351-1411 OT Time Calculation (min): 20 min Charges:  OT General Charges $OT Visit: 1 Procedure OT Evaluation $OT Eval Low Complexity: 1 Procedure G-Codes:    Terri Stevenson Oct 05, 2015, 2:49 PM  Terri Stevenson, OTR/L [phone] Oct 05, 2015

## 2015-10-03 LAB — CBC
HCT: 34.3 % — ABNORMAL LOW (ref 36.0–46.0)
Hemoglobin: 11.2 g/dL — ABNORMAL LOW (ref 12.0–15.0)
MCH: 29.6 pg (ref 26.0–34.0)
MCHC: 32.7 g/dL (ref 30.0–36.0)
MCV: 90.7 fL (ref 78.0–100.0)
PLATELETS: 224 10*3/uL (ref 150–400)
RBC: 3.78 MIL/uL — AB (ref 3.87–5.11)
RDW: 14.7 % (ref 11.5–15.5)
WBC: 7.4 10*3/uL (ref 4.0–10.5)

## 2015-10-03 MED ORDER — PANTOPRAZOLE SODIUM 20 MG PO TBEC
20.0000 mg | DELAYED_RELEASE_TABLET | Freq: Every day | ORAL | Status: DC
  Administered 2015-10-03: 20 mg via ORAL
  Filled 2015-10-03 (×2): qty 1

## 2015-10-03 MED ORDER — MAGNESIUM CITRATE PO SOLN
0.5000 | Freq: Once | ORAL | Status: AC
  Administered 2015-10-03: 0.5 via ORAL

## 2015-10-03 NOTE — Progress Notes (Signed)
Patient ID: Terri Stevenson, female   DOB: 12-28-1955, 60 y.o.   MRN: [id number]    Subjective: 2 Days Post-Op Procedure(s) (LRB): RIGHT TOTAL KNEE ARTHROPLASTY (Right) Patient reports pain as 4 on 0-10 scale.   Denies CP or SOB.  Voiding without difficulty. Positive flatus. No BM yet.  Concerned about stairs at her house.  Pt experiencing GERD symptoms this am Objective: Vital signs in last 24 hours: Temp:  [98.3 F (36.8 C)-98.5 F (36.9 C)] 98.4 F (36.9 C) (05/14 0522) Pulse Rate:  [65-74] 70 (05/14 0522) Resp:  [16-18] 18 (05/14 0522) BP: (110-141)/(46-59) 134/59 mmHg (05/14 0522) SpO2:  [97 %-99 %] 99 % (05/14 0522)  Intake/Output from previous day: 05/13 0701 - 05/14 0700 In: 720 [P.O.:720] Out: 1000 [Urine:1000] Intake/Output this shift:    Labs:  Recent Labs  10/02/15 0354 10/03/15 0431  HGB 11.9* 11.2*    Recent Labs  10/02/15 0354 10/03/15 0431  WBC 9.8 7.4  RBC 4.07 3.78*  HCT 36.6 34.3*  PLT 240 224    Recent Labs  10/02/15 0354  NA 141  K 4.5  CL 106  CO2 27  BUN 11  CREATININE 0.96  GLUCOSE 128*  CALCIUM 8.6*   No results for input(s): LABPT, INR in the last 72 hours.  Physical Exam: Neurologically intact ABD soft Sensation intact distally Incision: dressing C/D/I Compartment soft  Assessment/Plan: 2 Days Post-Op Procedure(s) (LRB): RIGHT TOTAL KNEE ARTHROPLASTY (Right) Up with Therapy - work on ambulation up stairs Pt may DC home after PT - orders done Consider Mag citrate - pt has not had BM since inpatient - order written  Kenly Xiao, Darla Lesches for Dr. Melina Schools [facility] [phone] 10/03/2015, 7:42 AM

## 2015-10-03 NOTE — Progress Notes (Signed)
Physical Therapy Treatment Patient Details Name: Terri Stevenson MRN: [id number] DOB: 1955-07-05 Today's Date: 10/03/2015    History of Present Illness [name]    PT Comments    Some difficulty with 4 steps but did make it up. Family available to assist  Into home with crutches.  Follow Up Recommendations  Home health PT     Equipment Recommendations  Rolling walker with 5" wheels    Recommendations for Other Services       Precautions / Restrictions Precautions Precautions: Knee;Fall Required Braces or Orthoses: Knee Immobilizer - Right Knee Immobilizer - Right: Discontinue once straight leg raise with < 10 degree lag    Mobility  Bed Mobility                  Transfers Overall transfer level: Needs assistance Equipment used: Rolling walker (2 wheeled) Transfers: Sit to/from Stand Sit to Stand: Min guard         General transfer comment: cues for hand placement and safety  Ambulation/Gait Ambulation/Gait assistance: Min assist Ambulation Distance (Feet): 20 Feet Assistive device: Rolling walker (2 wheeled) Gait Pattern/deviations: Step-to pattern;Antalgic     General Gait Details: cues for sequence, posture, stride length and position from RW.     Stairs Stairs: Yes Stairs assistance: +2 safety/equipment Stair Management: With crutches Number of Stairs: 4 General stair comments: attempted backward with RW, unable. with much extra time and cues, did negotiate 4 steps up with crutches and mod assist. Did not practice back down due to increased pain.  Wheelchair Mobility    Modified Rankin (Stroke Patients Only)       Balance                                    Cognition Arousal/Alertness: Awake/alert Behavior During Therapy: Anxious                        Exercises Total Joint Exercises Ankle Circles/Pumps: AROM;Both;10 reps;Supine Quad Sets: AROM;Both;10 reps;Supine Towel Squeeze: AROM;Both;10 reps Heel Slides:  AAROM;Right;10 reps;Supine Hip ABduction/ADduction: AAROM;Right;10 reps Straight Leg Raises: AAROM;Right;10 reps;Supine Goniometric ROM: 10-40 R knee    General Comments        Pertinent Vitals/Pain Pain Score: 7  Pain Location: R knee Pain Descriptors / Indicators: Aching;Constant;Grimacing;Guarding Pain Intervention(s): Limited activity within patient's tolerance;Monitored during session;Premedicated before session;Repositioned    Home Living                      Prior Function            PT Goals (current goals can now be found in the care plan section) Progress towards PT goals: Progressing toward goals    Frequency  7X/week    PT Plan Current plan remains appropriate    Co-evaluation             End of Session Equipment Utilized During Treatment: Right knee immobilizer;Gait belt Activity Tolerance: Patient limited by pain Patient left: in chair;with call bell/phone within reach     Time: 1030-1118 PT Time Calculation (min) (ACUTE ONLY): 48 min  Charges:  $Gait Training: 8-22 mins $Therapeutic Exercise: 8-22 mins $Self Care/Home Management: Jan 31, 2023                    G Codes:      Claretha Cooper 10/03/2015, 4:06 PM

## 2015-10-03 NOTE — Progress Notes (Signed)
Occupational Therapy Treatment Patient Details Name: Terri Stevenson MRN: [id number] DOB: 04/08/1956 Today's Date: 10/03/2015    History of present illness 60 yo female s/p R TKA PMH: gastric bypass, shoulder surg   OT comments  Pt currently limited by pain and nausea. Pt verbalized urgency to void bladder and able to complete task this session. Pt reports heart burn that started at 4am and is increasing in intensity. RN called to room. Pt currently upright in chair with medication / fluids on tray. Pt reports feeling better after voiding bladder but remains nauseous.   Follow Up Recommendations  Supervision/Assistance - 24 hour    Equipment Recommendations  3 in 1 bedside comode    Recommendations for Other Services      Precautions / Restrictions Precautions Precautions: Knee;Fall Required Braces or Orthoses: Knee Immobilizer - Right Knee Immobilizer - Right: Discontinue once straight leg raise with < 10 degree lag Restrictions Other Position/Activity Restrictions: WBAT       Mobility Bed Mobility Overal bed mobility: Needs Assistance Bed Mobility: Supine to Sit     Supine to sit: Mod assist     General bed mobility comments: (A) to advance buttock toward EOB with pad after > 2 minutes attempt without (A). Pt unable to support R LE Pt reports incr in pain with mobility  Transfers Overall transfer level: Needs assistance Equipment used: Rolling walker (2 wheeled) Transfers: Sit to/from Stand Sit to Stand: Min assist         General transfer comment: cues for hand placement and safety    Balance                                   ADL Overall ADL's : Needs assistance/impaired Eating/Feeding: Sitting;Set up   Grooming: Wash/dry hands;Wash/dry face;Oral care;Min guard;Sitting Grooming Details (indicate cue type and reason): requires sitting due to pain and balance deficits at this time                 Toilet Transfer: Minimal  assistance;BSC;RW Toilet Transfer Details (indicate cue type and reason): requires bil arm rest to push up from 3n1 Toileting- Clothing Manipulation and Hygiene: Minimal assistance       Functional mobility during ADLs: Minimal assistance;Rolling walker General ADL Comments: pt nauseated and taking rest breaks with ambulation. pt reports greatly incr in pain since surgery. Pt asking "why does this hurt so much more than yesterday?" pt educated on bed positioning with x2 dogs at home. pt scared 60 pound dog will jump  on her. Educated on ways to keep dog away. pt required (A) with bed mobility this session and supporting R LE. Pt anxious to go home today      Vision                     Perception     Praxis      Cognition   Behavior During Therapy: [facility] for tasks assessed/performed Overall Cognitive Status: Within Functional Limits for tasks assessed                       Extremity/Trunk Assessment               Exercises     Shoulder Instructions       General Comments      Pertinent Vitals/ Pain       Pain Assessment: Faces Faces Pain Scale:  Hurts whole lot Pain Location: R knee and heart burn Pain Descriptors / Indicators: Constant;Grimacing;Operative site guarding Pain Intervention(s): Limited activity within patient's tolerance;Monitored during session;Premedicated before session;Repositioned  Home Living                                          Prior Functioning/Environment              Frequency Min 2X/week     Progress Toward Goals  OT Goals(current goals can now be found in the care plan section)  Progress towards OT goals: Progressing toward goals  Acute Rehab OT Goals Patient Stated Goal: Never have this done again OT Goal Formulation: With patient Time For Goal Achievement: 10/09/15 Potential to Achieve Goals: Good ADL Goals Pt Will Transfer to Toilet: with min assist;ambulating;bedside commode Pt Will  Perform Tub/Shower Transfer: Shower transfer;ambulating;3 in 1;with min assist  Plan Discharge plan remains appropriate    Co-evaluation                 End of Session Equipment Utilized During Treatment: Gait belt;Rolling walker;Right knee immobilizer   Activity Tolerance Patient tolerated treatment well   Patient Left in chair;with call bell/phone within reach   Nurse Communication Mobility status;Precautions        Time: [id number] OT Time Calculation (min): 31 min  Charges: OT General Charges $OT Visit: 1 Procedure OT Treatments $Self Care/Home Management : 23-37 mins  Parke Poisson B 10/03/2015, 9:37 AM   Jeri Modena   OTR/L [phone] Office: [phone] .

## 2015-10-03 NOTE — Discharge Instructions (Signed)
Follow up with Dr Theda Sers 2 weeks after surgery. Call to make appointment. [phone].

## 2015-10-03 NOTE — Progress Notes (Signed)
Discharged from floor via w/c for transport home by car. Belongings & spouse with pt. No changes in assessment. Terri Stevenson  

## 2015-10-04 ENCOUNTER — Encounter (HOSPITAL_COMMUNITY): Payer: Self-pay | Admitting: Specialist

## 2015-10-07 ENCOUNTER — Other Ambulatory Visit (HOSPITAL_COMMUNITY): Payer: Self-pay | Admitting: Specialist

## 2015-10-07 ENCOUNTER — Ambulatory Visit (HOSPITAL_COMMUNITY)
Admission: RE | Admit: 2015-10-07 | Discharge: 2015-10-07 | Disposition: A | Discharge status: Home / Self Care | Payer: Managed Care, Other (non HMO) | Source: Ambulatory Visit | Attending: Cardiovascular Disease | Admitting: Cardiovascular Disease

## 2015-10-07 DIAGNOSIS — M79604 Pain in right leg: Secondary | ICD-10-CM | POA: Diagnosis not present

## 2015-10-07 DIAGNOSIS — E669 Obesity, unspecified: Secondary | ICD-10-CM | POA: Diagnosis not present

## 2015-10-07 DIAGNOSIS — E785 Hyperlipidemia, unspecified: Secondary | ICD-10-CM | POA: Insufficient documentation

## 2015-10-07 DIAGNOSIS — M7989 Other specified soft tissue disorders: Secondary | ICD-10-CM

## 2015-10-07 DIAGNOSIS — I82531 Chronic embolism and thrombosis of right popliteal vein: Secondary | ICD-10-CM | POA: Diagnosis not present

## 2015-10-13 NOTE — Discharge Summary (Signed)
Physician Discharge Summary  Patient ID: Terri Stevenson MRN: [id number] DOB/AGE: 07/17/1955 38 y.o.  Admit date: 10/01/2015 Discharge date: 10/13/2015  Admission Diagnoses:  Right kneeosteoarthritis  Discharge Diagnoses:  Active Problems:   Osteoarthritis of right knee   S/P knee replacement   Past Medical History  Diagnosis Date  . Blood clot in vein     left leg   . Arthritis     Surgeries: Procedure(s): RIGHT TOTAL KNEE ARTHROPLASTY on 10/01/2015   Consultants (if any):    Discharged Condition: Improved  Hospital Course: Terri Stevenson is an 60 y.o. female who was admitted 10/01/2015 with a diagnosis of right knee osteoarthritis and went to the operating room on 10/01/2015 and underwent the above named procedures.  Pt discharged on 10/03/15.  She was given perioperative antibiotics:  Anti-infectives    Start     Dose/Rate Route Frequency Ordered Stop   10/01/15 2030  ceFAZolin (ANCEF) IVPB 2g/100 mL premix     2 g 200 mL/hr over 30 Minutes Intravenous Every 6 hours 10/01/15 1855 10/02/15 0329   10/01/15 1830  ceFAZolin (ANCEF) IVPB 2g/100 mL premix  Status:  Discontinued     2 g 200 mL/hr over 30 Minutes Intravenous Every 6 hours 10/01/15 1818 10/01/15 1855   10/01/15 1055  ceFAZolin (ANCEF) IVPB 2g/100 mL premix     2 g 200 mL/hr over 30 Minutes Intravenous On call to O.R. 10/01/15 1055 10/01/15 1421    .  She was given sequential compression devices, early ambulation, and TED for DVT prophylaxis.  She benefited maximally from the hospital stay and there were no complications.    Recent vital signs:  Filed Vitals:   10/02/15 2058 10/03/15 0522  BP: 141/53 134/59  Pulse: 74 70  Temp: 98.5 F (36.9 C) 98.4 F (36.9 C)  Resp: 18 18    Recent laboratory studies:  Lab Results  Component Value Date   HGB 11.2* 10/03/2015   HGB 11.9* 10/02/2015   HGB 13.6 09/22/2015   Lab Results  Component Value Date   WBC 7.4 10/03/2015   PLT 224 10/03/2015   Lab  Results  Component Value Date   INR 1.00 09/22/2015   Lab Results  Component Value Date   NA 141 10/02/2015   K 4.5 10/02/2015   CL 106 10/02/2015   CO2 27 10/02/2015   BUN 11 10/02/2015   CREATININE 0.96 10/02/2015   GLUCOSE 128* 10/02/2015    Discharge Medications:     Medication List    TAKE these medications        ALIGN PO  Take 1 capsule by mouth daily as needed (For digestive support.).     aspirin EC 325 MG tablet  Take 1 tablet (325 mg total) by mouth 2 (two) times daily.     calcium carbonate 600 MG Tabs tablet  Commonly known as:  OS-CAL  Take 600 mg by mouth 2 (two) times daily.     ferrous sulfate 325 (65 FE) MG tablet  Take 325 mg by mouth daily with breakfast.     medroxyPROGESTERone 10 MG tablet  Commonly known as:  PROVERA  Take 10 mg by mouth See admin instructions. She is to take daily for 14 days for a total of 3 months.     methocarbamol 500 MG tablet  Commonly known as:  ROBAXIN  Take 1 tablet (500 mg total) by mouth every 8 (eight) hours as needed for muscle spasms.     MULTIVITAMIN ADULT Chew  Chew 1 tablet by mouth daily.     Naltrexone-Bupropion HCl ER 8-90 MG Tb12  Commonly known as:  CONTRAVE  Take 2 tablets by mouth 2 (two) times daily.     oxyCODONE-acetaminophen 5-325 MG tablet  Commonly known as:  ROXICET  Take 1-2 tablets by mouth every 4 (four) hours as needed for severe pain.     vitamin B-12 500 MCG tablet  Commonly known as:  CYANOCOBALAMIN  Take 500 mcg by mouth daily.     Vitamin D3 5000 units Tabs  Take 5,000 Units by mouth daily.        Diagnostic Studies: No results found.  Disposition: 06-Home-Health Care Svc      Discharge Instructions    Call MD / Call 911    Complete by:  As directed   If you experience chest pain or shortness of breath, CALL 911 and be transported to the hospital emergency room.  If you develope a fever above 101 F, pus (white drainage) or increased drainage or redness at the wound,  or calf pain, call your surgeon's office.     Constipation Prevention    Complete by:  As directed   Drink plenty of fluids.  Prune juice may be helpful.  You may use a stool softener, such as Colace (over the counter) 100 mg twice a day.  Use MiraLax (over the counter) for constipation as needed.     Diet - low sodium heart healthy    Complete by:  As directed      Discharge instructions    Complete by:  As directed   INSTRUCTIONS AFTER JOINT REPLACEMENT   Remove items at home which could result in a fall. This includes throw rugs or furniture in walking pathways ICE to the affected joint every three hours while awake for 30 minutes at a time, for at least the first 3-5 days, and then as needed for pain and swelling.  Continue to use ice for pain and swelling. You may notice swelling that will progress down to the foot and ankle.  This is normal after surgery.  Elevate your leg when you are not up walking on it.   Continue to use the breathing machine you got in the hospital (incentive spirometer) which will help keep your temperature down.  It is common for your temperature to cycle up and down following surgery, especially at night when you are not up moving around and exerting yourself.  The breathing machine keeps your lungs expanded and your temperature down.   DIET:  As you were doing prior to hospitalization, we recommend a well-balanced diet.  DRESSING / WOUND CARE / SHOWERING  Keep the surgical dressing until follow up.  The dressing is water proof, so you can shower without any extra covering.  IF THE DRESSING FALLS OFF or the wound gets wet inside, change the dressing with sterile gauze.  Please use good hand washing techniques before changing the dressing.  Do not use any lotions or creams on the incision until instructed by your surgeon.    ACTIVITY  Increase activity slowly as tolerated, but follow the weight bearing instructions below.   No driving for 6 weeks or until further  direction given by your physician.  You cannot drive while taking narcotics.  No lifting or carrying greater than 10 lbs. until further directed by your surgeon. Avoid periods of inactivity such as sitting longer than an hour when not asleep. This helps prevent blood clots.  You  may return to work once you are authorized by your doctor.     WEIGHT BEARING   Weight bearing as tolerated with assist device (walker, cane, etc) as directed, use it as long as suggested by your surgeon or therapist, typically at least 4-6 weeks.   EXERCISES  Results after joint replacement surgery are often greatly improved when you follow the exercise, range of motion and muscle strengthening exercises prescribed by your doctor. Safety measures are also important to protect the joint from further injury. Any time any of these exercises cause you to have increased pain or swelling, decrease what you are doing until you are comfortable again and then slowly increase them. If you have problems or questions, call your caregiver or physical therapist for advice.   Rehabilitation is important following a joint replacement. After just a few days of immobilization, the muscles of the leg can become weakened and shrink (atrophy).  These exercises are designed to build up the tone and strength of the thigh and leg muscles and to improve motion. Often times heat used for twenty to thirty minutes before working out will loosen up your tissues and help with improving the range of motion but do not use heat for the first two weeks following surgery (sometimes heat can increase post-operative swelling).   These exercises can be done on a training (exercise) mat, on the floor, on a table or on a bed. Use whatever works the best and is most comfortable for you.    Use music or television while you are exercising so that the exercises are a pleasant break in your day. This will make your life better with the exercises acting as a break in  your routine that you can look forward to.   Perform all exercises about fifteen times, three times per day or as directed.  You should exercise both the operative leg and the other leg as well.   Exercises include:   Quad Sets - Tighten up the muscle on the front of the thigh (Quad) and hold for 5-10 seconds.   Straight Leg Raises - With your knee straight (if you were given a brace, keep it on), lift the leg to 60 degrees, hold for 3 seconds, and slowly lower the leg.  Perform this exercise against resistance later as your leg gets stronger.  Leg Slides: Lying on your back, slowly slide your foot toward your buttocks, bending your knee up off the floor (only go as far as is comfortable). Then slowly slide your foot back down until your leg is flat on the floor again.  Angel Wings: Lying on your back spread your legs to the side as far apart as you can without causing discomfort.  Hamstring Strength:  Lying on your back, push your heel against the floor with your leg straight by tightening up the muscles of your buttocks.  Repeat, but this time bend your knee to a comfortable angle, and push your heel against the floor.  You may put a pillow under the heel to make it more comfortable if necessary.   A rehabilitation program following joint replacement surgery can speed recovery and prevent re-injury in the future due to weakened muscles. Contact your doctor or a physical therapist for more information on knee rehabilitation.    CONSTIPATION  Constipation is defined medically as fewer than three stools per week and severe constipation as less than one stool per week.  Even if you have a regular bowel pattern at home,  your normal regimen is likely to be disrupted due to multiple reasons following surgery.  Combination of anesthesia, postoperative narcotics, change in appetite and fluid intake all can affect your bowels.   YOU MUST use at least one of the following options; they are listed in order of  increasing strength to get the job done.  They are all available over the counter, and you may need to use some, POSSIBLY even all of these options:    Drink plenty of fluids (prune juice may be helpful) and high fiber foods Colace 100 mg by mouth twice a day  Senokot for constipation as directed and as needed Dulcolax (bisacodyl), take with full glass of water  Miralax (polyethylene glycol) once or twice a day as needed.  If you have tried all these things and are unable to have a bowel movement in the first 3-4 days after surgery call either your surgeon or your primary doctor.    If you experience loose stools or diarrhea, hold the medications until you stool forms back up.  If your symptoms do not get better within 1 week or if they get worse, check with your doctor.  If you experience "the worst abdominal pain ever" or develop nausea or vomiting, please contact the office immediately for further recommendations for treatment.   ITCHING:  If you experience itching with your medications, try taking only a single pain pill, or even half a pain pill at a time.  You can also use Benadryl over the counter for itching or also to help with sleep.   TED HOSE STOCKINGS:  Use stockings on both legs until for at least 2 weeks or as directed by physician office. They may be removed at night for sleeping.  MEDICATIONS:  See your medication summary on the "After Visit Summary" that nursing will review with you.  You may have some home medications which will be placed on hold until you complete the course of blood thinner medication.  It is important for you to complete the blood thinner medication as prescribed.  PRECAUTIONS:  If you experience chest pain or shortness of breath - call 911 immediately for transfer to the hospital emergency department.   If you develop a fever greater that 101 F, purulent drainage from wound, increased redness or drainage from wound, foul odor from the wound/dressing, or  calf pain - CONTACT YOUR SURGEON.                                                   FOLLOW-UP APPOINTMENTS:  If you do not already have a post-op appointment, please call the office for an appointment to be seen by your surgeon.  Guidelines for how soon to be seen are listed in your "After Visit Summary", but are typically between 1-4 weeks after surgery.  OTHER INSTRUCTIONS:   Knee Replacement:  Do not place pillow under knee, focus on keeping the knee straight while resting. CPM instructions: 0-90 degrees, 2 hours in the morning, 2 hours in the afternoon, and 2 hours in the evening. Place foam block, curve side up under heel at all times except when in CPM or when walking.  DO NOT modify, tear, cut, or change the foam block in any way.  MAKE SURE YOU:  Understand these instructions.  Get help right away if you are not doing  well or get worse.    Thank you for letting us be a part of your medical care team.  It is a privilege we respect greatly.  We hope these instructions will help you stay on track for a fast and full recovery!     Do not put a pillow under the knee. Place it under the heel.    Complete by:  As directed      Increase activity slowly as tolerated    Complete by:  As directed            Follow-up Information    Follow up with [facility].   Why:  Home Health Physical Therapy   Contact information:   [place] [address] [phone]        Signed: Valinda Hoar 10/13/2015, 8:18 AM

## 2015-10-28 ENCOUNTER — Ambulatory Visit: Payer: Managed Care, Other (non HMO) | Admitting: Family Medicine

## 2015-11-18 ENCOUNTER — Ambulatory Visit: Payer: Managed Care, Other (non HMO) | Admitting: Family Medicine

## 2015-12-07 ENCOUNTER — Ambulatory Visit (INDEPENDENT_AMBULATORY_CARE_PROVIDER_SITE_OTHER): Payer: Managed Care, Other (non HMO) | Admitting: Family Medicine

## 2015-12-07 ENCOUNTER — Encounter: Payer: Self-pay | Admitting: Family Medicine

## 2015-12-07 VITALS — BP 122/78 | HR 61 | Temp 98.3°F | Ht 64.0 in | Wt 212.2 lb

## 2015-12-07 DIAGNOSIS — Z0000 Encounter for general adult medical examination without abnormal findings: Secondary | ICD-10-CM | POA: Diagnosis not present

## 2015-12-07 DIAGNOSIS — Z1159 Encounter for screening for other viral diseases: Secondary | ICD-10-CM

## 2015-12-07 LAB — COMPREHENSIVE METABOLIC PANEL
ALT: 9 U/L (ref 0–35)
AST: 13 U/L (ref 0–37)
Albumin: 4.1 g/dL (ref 3.5–5.2)
Alkaline Phosphatase: 82 U/L (ref 39–117)
BUN: 16 mg/dL (ref 6–23)
CALCIUM: 9.4 mg/dL (ref 8.4–10.5)
CHLORIDE: 105 meq/L (ref 96–112)
CO2: 31 meq/L (ref 19–32)
Creatinine, Ser: 0.86 mg/dL (ref 0.40–1.20)
GFR: 71.81 mL/min (ref >60.00)
Glucose, Bld: 96 mg/dL (ref 70–99)
POTASSIUM: 4.3 meq/L (ref 3.5–5.1)
Sodium: 139 mEq/L (ref 135–145)
Total Bilirubin: 0.6 mg/dL (ref 0.2–1.2)
Total Protein: 7 g/dL (ref 6.0–8.3)

## 2015-12-07 LAB — CBC WITH DIFFERENTIAL/PLATELET
BASOS ABS: 0 10*3/uL (ref 0.0–0.1)
Basophils Relative: 0.5 % (ref 0.0–3.0)
Eosinophils Absolute: 0.1 10*3/uL (ref 0.0–0.7)
Eosinophils Relative: 1.5 % (ref 0.0–5.0)
HEMATOCRIT: 39.7 % (ref 36.0–46.0)
Hemoglobin: 13.3 g/dL (ref 12.0–15.0)
LYMPHS PCT: 26.6 % (ref 12.0–46.0)
Lymphs Abs: 1.5 10*3/uL (ref 0.7–4.0)
MCHC: 33.3 g/dL (ref 30.0–36.0)
MCV: 86.4 fl (ref 78.0–100.0)
MONOS PCT: 4.6 % (ref 3.0–12.0)
Monocytes Absolute: 0.2 10*3/uL (ref 0.1–1.0)
NEUTROS ABS: 3.6 10*3/uL (ref 1.4–7.7)
Neutrophils Relative %: 66.8 % (ref 43.0–77.0)
PLATELETS: 281 10*3/uL (ref 150.0–400.0)
RBC: 4.6 Mil/uL (ref 3.87–5.11)
RDW: 14.6 % (ref 11.5–15.5)
WBC: 5.4 10*3/uL (ref 4.0–10.5)

## 2015-12-07 LAB — LIPID PANEL
CHOL/HDL RATIO: 3
Cholesterol: 198 mg/dL (ref 0–200)
HDL: 62.2 mg/dL (ref >39.00)
LDL Cholesterol: 124 mg/dL — ABNORMAL HIGH (ref 0–99)
NONHDL: 135.94
Triglycerides: 61 mg/dL (ref 0.0–149.0)
VLDL: 12.2 mg/dL (ref 0.0–40.0)

## 2015-12-07 LAB — TSH: TSH: 1.22 u[IU]/mL (ref 0.35–4.50)

## 2015-12-07 MED ORDER — NALTREXONE-BUPROPION HCL ER 8-90 MG PO TB12: 2.0000 | ORAL_TABLET | Freq: Two times a day (BID) | ORAL | Status: AC

## 2015-12-07 NOTE — Patient Instructions (Signed)
Preventive Care for Adults, Female A healthy lifestyle and preventive care can promote health and wellness. Preventive health guidelines for women include the following key practices.  A routine yearly physical is a good way to check with your health care provider about your health and preventive screening. It is a chance to share any concerns and updates on your health and to receive a thorough exam.  Visit your dentist for a routine exam and preventive care every 6 months. Brush your teeth twice a day and floss once a day. Good oral hygiene prevents tooth decay and gum disease.  The frequency of eye exams is based on your age, health, family medical history, use of contact lenses, and other factors. Follow your health care provider's recommendations for frequency of eye exams.  Eat a healthy diet. Foods like vegetables, fruits, whole grains, low-fat dairy products, and lean protein foods contain the nutrients you need without too many calories. Decrease your intake of foods high in solid fats, added sugars, and salt. Eat the right amount of calories for you.Get information about a proper diet from your health care provider, if necessary.  Regular physical exercise is one of the most important things you can do for your health. Most adults should get at least 150 minutes of moderate-intensity exercise (any activity that increases your heart rate and causes you to sweat) each week. In addition, most adults need muscle-strengthening exercises on 2 or more days a week.  Maintain a healthy weight. The body mass index (BMI) is a screening tool to identify possible weight problems. It provides an estimate of body fat based on height and weight. Your health care provider can find your BMI and can help you achieve or maintain a healthy weight.For adults 20 years and older:  A BMI below 18.5 is considered underweight.  A BMI of 18.5 to 24.9 is normal.  A BMI of 25 to 29.9 is considered overweight.  A  BMI of 30 and above is considered obese.  Maintain normal blood lipids and cholesterol levels by exercising and minimizing your intake of saturated fat. Eat a balanced diet with plenty of fruit and vegetables. Blood tests for lipids and cholesterol should begin at age 45 and be repeated every 5 years. If your lipid or cholesterol levels are high, you are over 50, or you are at high risk for heart disease, you may need your cholesterol levels checked more frequently.Ongoing high lipid and cholesterol levels should be treated with medicines if diet and exercise are not working.  If you smoke, find out from your health care provider how to quit. If you do not use tobacco, do not start.  Lung cancer screening is recommended for adults aged 45-80 years who are at high risk for developing lung cancer because of a history of smoking. A yearly low-dose CT scan of the lungs is recommended for people who have at least a 30-pack-year history of smoking and are a current smoker or have quit within the past 15 years. A pack year of smoking is smoking an average of 1 pack of cigarettes a day for 1 year (for example: 1 pack a day for 30 years or 2 packs a day for 15 years). Yearly screening should continue until the smoker has stopped smoking for at least 15 years. Yearly screening should be stopped for people who develop a health problem that would prevent them from having lung cancer treatment.  If you are pregnant, do not drink alcohol. If you are  breastfeeding, be very cautious about drinking alcohol. If you are not pregnant and choose to drink alcohol, do not have more than 1 drink per day. One drink is considered to be 12 ounces (355 mL) of beer, 5 ounces (148 mL) of wine, or 1.5 ounces (44 mL) of liquor.  Avoid use of street drugs. Do not share needles with anyone. Ask for help if you need support or instructions about stopping the use of drugs.  High blood pressure causes heart disease and increases the risk  of stroke. Your blood pressure should be checked at least every 1 to 2 years. Ongoing high blood pressure should be treated with medicines if weight loss and exercise do not work.  If you are 55-79 years old, ask your health care provider if you should take aspirin to prevent strokes.  Diabetes screening is done by taking a blood sample to check your blood glucose level after you have not eaten for a certain period of time (fasting). If you are not overweight and you do not have risk factors for diabetes, you should be screened once every 3 years starting at age 45. If you are overweight or obese and you are 40-70 years of age, you should be screened for diabetes every year as part of your cardiovascular risk assessment.  Breast cancer screening is essential preventive care for women. You should practice "breast self-awareness." This means understanding the normal appearance and feel of your breasts and may include breast self-examination. Any changes detected, no matter how small, should be reported to a health care provider. Women in their 20s and 30s should have a clinical breast exam (CBE) by a health care provider as part of a regular health exam every 1 to 3 years. After age 40, women should have a CBE every year. Starting at age 40, women should consider having a mammogram (breast X-ray test) every year. Women who have a family history of breast cancer should talk to their health care provider about genetic screening. Women at a high risk of breast cancer should talk to their health care providers about having an MRI and a mammogram every year.  Breast cancer gene (BRCA)-related cancer risk assessment is recommended for women who have family members with BRCA-related cancers. BRCA-related cancers include breast, ovarian, tubal, and peritoneal cancers. Having family members with these cancers may be associated with an increased risk for harmful changes (mutations) in the breast cancer genes BRCA1 and  BRCA2. Results of the assessment will determine the need for genetic counseling and BRCA1 and BRCA2 testing.  Your health care provider may recommend that you be screened regularly for cancer of the pelvic organs (ovaries, uterus, and vagina). This screening involves a pelvic examination, including checking for microscopic changes to the surface of your cervix (Pap test). You may be encouraged to have this screening done every 3 years, beginning at age 21.  For women ages 30-65, health care providers may recommend pelvic exams and Pap testing every 3 years, or they may recommend the Pap and pelvic exam, combined with testing for human papilloma virus (HPV), every 5 years. Some types of HPV increase your risk of cervical cancer. Testing for HPV may also be done on women of any age with unclear Pap test results.  Other health care providers may not recommend any screening for nonpregnant women who are considered low risk for pelvic cancer and who do not have symptoms. Ask your health care provider if a screening pelvic exam is right for   you.  If you have had past treatment for cervical cancer or a condition that could lead to cancer, you need Pap tests and screening for cancer for at least 20 years after your treatment. If Pap tests have been discontinued, your risk factors (such as having a new sexual partner) need to be reassessed to determine if screening should resume. Some women have medical problems that increase the chance of getting cervical cancer. In these cases, your health care provider may recommend more frequent screening and Pap tests.  Colorectal cancer can be detected and often prevented. Most routine colorectal cancer screening begins at the age of 50 years and continues through age 75 years. However, your health care provider may recommend screening at an earlier age if you have risk factors for colon cancer. On a yearly basis, your health care provider may provide home test kits to check  for hidden blood in the stool. Use of a small camera at the end of a tube, to directly examine the colon (sigmoidoscopy or colonoscopy), can detect the earliest forms of colorectal cancer. Talk to your health care provider about this at age 50, when routine screening begins. Direct exam of the colon should be repeated every 5-10 years through age 75 years, unless early forms of precancerous polyps or small growths are found.  People who are at an increased risk for hepatitis B should be screened for this virus. You are considered at high risk for hepatitis B if:  You were born in a country where hepatitis B occurs often. Talk with your health care provider about which countries are considered high risk.  Your parents were born in a high-risk country and you have not received a shot to protect against hepatitis B (hepatitis B vaccine).  You have HIV or AIDS.  You use needles to inject street drugs.  You live with, or have sex with, someone who has hepatitis B.  You get hemodialysis treatment.  You take certain medicines for conditions like cancer, organ transplantation, and autoimmune conditions.  Hepatitis C blood testing is recommended for all people born from 1945 through 1965 and any individual with known risks for hepatitis C.  Practice safe sex. Use condoms and avoid high-risk sexual practices to reduce the spread of sexually transmitted infections (STIs). STIs include gonorrhea, chlamydia, syphilis, trichomonas, herpes, HPV, and human immunodeficiency virus (HIV). Herpes, HIV, and HPV are viral illnesses that have no cure. They can result in disability, cancer, and death.  You should be screened for sexually transmitted illnesses (STIs) including gonorrhea and chlamydia if:  You are sexually active and are younger than 24 years.  You are older than 24 years and your health care provider tells you that you are at risk for this type of infection.  Your sexual activity has changed  since you were last screened and you are at an increased risk for chlamydia or gonorrhea. Ask your health care provider if you are at risk.  If you are at risk of being infected with HIV, it is recommended that you take a prescription medicine daily to prevent HIV infection. This is called preexposure prophylaxis (PrEP). You are considered at risk if:  You are sexually active and do not regularly use condoms or know the HIV status of your partner(s).  You take drugs by injection.  You are sexually active with a partner who has HIV.  Talk with your health care provider about whether you are at high risk of being infected with HIV. If   you choose to begin PrEP, you should first be tested for HIV. You should then be tested every 3 months for as long as you are taking PrEP.  Osteoporosis is a disease in which the bones lose minerals and strength with aging. This can result in serious bone fractures or breaks. The risk of osteoporosis can be identified using a bone density scan. Women ages 58 years and over and women at risk for fractures or osteoporosis should discuss screening with their health care providers. Ask your health care provider whether you should take a calcium supplement or vitamin D to reduce the rate of osteoporosis.  Menopause can be associated with physical symptoms and risks. Hormone replacement therapy is available to decrease symptoms and risks. You should talk to your health care provider about whether hormone replacement therapy is right for you.  Use sunscreen. Apply sunscreen liberally and repeatedly throughout the day. You should seek shade when your shadow is shorter than you. Protect yourself by wearing long sleeves, pants, a wide-brimmed hat, and sunglasses year round, whenever you are outdoors.  Once a month, do a whole body skin exam, using a mirror to look at the skin on your back. Tell your health care provider of new moles, moles that have irregular borders, moles that  are larger than a pencil eraser, or moles that have changed in shape or color.  Stay current with required vaccines (immunizations).  Influenza vaccine. All adults should be immunized every year.  Tetanus, diphtheria, and acellular pertussis (Td, Tdap) vaccine. Pregnant women should receive 1 dose of Tdap vaccine during each pregnancy. The dose should be obtained regardless of the length of time since the last dose. Immunization is preferred during the 27th-36th week of gestation. An adult who has not previously received Tdap or who does not know her vaccine status should receive 1 dose of Tdap. This initial dose should be followed by tetanus and diphtheria toxoids (Td) booster doses every 10 years. Adults with an unknown or incomplete history of completing a 3-dose immunization series with Td-containing vaccines should begin or complete a primary immunization series including a Tdap dose. Adults should receive a Td booster every 10 years.  Varicella vaccine. An adult without evidence of immunity to varicella should receive 2 doses or a second dose if she has previously received 1 dose. Pregnant females who do not have evidence of immunity should receive the first dose after pregnancy. This first dose should be obtained before leaving the health care facility. The second dose should be obtained 4-8 weeks after the first dose.  Human papillomavirus (HPV) vaccine. Females aged 13-26 years who have not received the vaccine previously should obtain the 3-dose series. The vaccine is not recommended for use in pregnant females. However, pregnancy testing is not needed before receiving a dose. If a female is found to be pregnant after receiving a dose, no treatment is needed. In that case, the remaining doses should be delayed until after the pregnancy. Immunization is recommended for any person with an immunocompromised condition through the age of 37 years if she did not get any or all doses earlier. During the  3-dose series, the second dose should be obtained 4-8 weeks after the first dose. The third dose should be obtained 24 weeks after the first dose and 16 weeks after the second dose.  Zoster vaccine. One dose is recommended for adults aged 57 years or older unless certain conditions are present.  Measles, mumps, and rubella (MMR) vaccine. Adults born  before 1957 generally are considered immune to measles and mumps. Adults born in 1957 or later should have 1 or more doses of MMR vaccine unless there is a contraindication to the vaccine or there is laboratory evidence of immunity to each of the three diseases. A routine second dose of MMR vaccine should be obtained at least 28 days after the first dose for students attending postsecondary schools, health care workers, or international travelers. People who received inactivated measles vaccine or an unknown type of measles vaccine during 1963-1967 should receive 2 doses of MMR vaccine. People who received inactivated mumps vaccine or an unknown type of mumps vaccine before 1979 and are at high risk for mumps infection should consider immunization with 2 doses of MMR vaccine. For females of childbearing age, rubella immunity should be determined. If there is no evidence of immunity, females who are not pregnant should be vaccinated. If there is no evidence of immunity, females who are pregnant should delay immunization until after pregnancy. Unvaccinated health care workers born before 1957 who lack laboratory evidence of measles, mumps, or rubella immunity or laboratory confirmation of disease should consider measles and mumps immunization with 2 doses of MMR vaccine or rubella immunization with 1 dose of MMR vaccine.  Pneumococcal 13-valent conjugate (PCV13) vaccine. When indicated, a person who is uncertain of his immunization history and has no record of immunization should receive the PCV13 vaccine. All adults 65 years of age and older should receive this  vaccine. An adult aged 19 years or older who has certain medical conditions and has not been previously immunized should receive 1 dose of PCV13 vaccine. This PCV13 should be followed with a dose of pneumococcal polysaccharide (PPSV23) vaccine. Adults who are at high risk for pneumococcal disease should obtain the PPSV23 vaccine at least 8 weeks after the dose of PCV13 vaccine. Adults older than 59 years of age who have normal immune system function should obtain the PPSV23 vaccine dose at least 1 year after the dose of PCV13 vaccine.  Pneumococcal polysaccharide (PPSV23) vaccine. When PCV13 is also indicated, PCV13 should be obtained first. All adults aged 65 years and older should be immunized. An adult younger than age 65 years who has certain medical conditions should be immunized. Any person who resides in a nursing home or long-term care facility should be immunized. An adult smoker should be immunized. People with an immunocompromised condition and certain other conditions should receive both PCV13 and PPSV23 vaccines. People with human immunodeficiency virus (HIV) infection should be immunized as soon as possible after diagnosis. Immunization during chemotherapy or radiation therapy should be avoided. Routine use of PPSV23 vaccine is not recommended for American Indians, [place] Natives, or people younger than 65 years unless there are medical conditions that require PPSV23 vaccine. When indicated, people who have unknown immunization and have no record of immunization should receive PPSV23 vaccine. One-time revaccination 5 years after the first dose of PPSV23 is recommended for people aged 19-64 years who have chronic kidney failure, nephrotic syndrome, asplenia, or immunocompromised conditions. People who received 1-2 doses of PPSV23 before age 65 years should receive another dose of PPSV23 vaccine at age 65 years or later if at least 5 years have passed since the previous dose. Doses of PPSV23 are not  needed for people immunized with PPSV23 at or after age 65 years.  Meningococcal vaccine. Adults with asplenia or persistent complement component deficiencies should receive 2 doses of quadrivalent meningococcal conjugate (MenACWY-D) vaccine. The doses should be obtained   at least 2 months apart. Microbiologists working with certain meningococcal bacteria, [place] recruits, people at risk during an outbreak, and people who travel to or live in countries with a high rate of meningitis should be immunized. A first-year college student up through age 47 years who is living in a residence hall should receive a dose if she did not receive a dose on or after her 16th birthday. Adults who have certain high-risk conditions should receive one or more doses of vaccine.  Hepatitis A vaccine. Adults who wish to be protected from this disease, have certain high-risk conditions, work with hepatitis A-infected animals, work in hepatitis A research labs, or travel to or work in countries with a high rate of hepatitis A should be immunized. Adults who were previously unvaccinated and who anticipate close contact with an international adoptee during the first 60 days after arrival in the [place] from a country with a high rate of hepatitis A should be immunized.  Hepatitis B vaccine. Adults who wish to be protected from this disease, have certain high-risk conditions, may be exposed to blood or other infectious body fluids, are household contacts or sex partners of hepatitis B positive people, are clients or workers in certain care facilities, or travel to or work in countries with a high rate of hepatitis B should be immunized.  Haemophilus influenzae type b (Hib) vaccine. A previously unvaccinated person with asplenia or sickle cell disease or having a scheduled splenectomy should receive 1 dose of Hib vaccine. Regardless of previous immunization, a recipient of a hematopoietic stem cell transplant should receive a  3-dose series 6-12 months after her successful transplant. Hib vaccine is not recommended for adults with HIV infection. Preventive Services / Frequency Ages 67 to 82 years  Blood pressure check.** / Every 3-5 years.  Lipid and cholesterol check.** / Every 5 years beginning at age 24.  Clinical breast exam.** / Every 3 years for women in their 35s and 44s.  BRCA-related cancer risk assessment.** / For women who have family members with a BRCA-related cancer (breast, ovarian, tubal, or peritoneal cancers).  Pap test.** / Every 2 years from ages 36 through 1. Every 3 years starting at age 63 through age 71 or 26 with a history of 3 consecutive normal Pap tests.  HPV screening.** / Every 3 years from ages 60 through ages 33 to 34 with a history of 3 consecutive normal Pap tests.  Hepatitis C blood test.** / For any individual with known risks for hepatitis C.  Skin self-exam. / Monthly.  Influenza vaccine. / Every year.  Tetanus, diphtheria, and acellular pertussis (Tdap, Td) vaccine.** / Consult your health care provider. Pregnant women should receive 1 dose of Tdap vaccine during each pregnancy. 1 dose of Td every 10 years.  Varicella vaccine.** / Consult your health care provider. Pregnant females who do not have evidence of immunity should receive the first dose after pregnancy.  HPV vaccine. / 3 doses over 6 months, if 47 and younger. The vaccine is not recommended for use in pregnant females. However, pregnancy testing is not needed before receiving a dose.  Measles, mumps, rubella (MMR) vaccine.** / You need at least 1 dose of MMR if you were born in 1957 or later. You may also need a 2nd dose. For females of childbearing age, rubella immunity should be determined. If there is no evidence of immunity, females who are not pregnant should be vaccinated. If there is no evidence of immunity, females who are  pregnant should delay immunization until after pregnancy.  Pneumococcal  13-valent conjugate (PCV13) vaccine.** / Consult your health care provider.  Pneumococcal polysaccharide (PPSV23) vaccine.** / 1 to 2 doses if you smoke cigarettes or if you have certain conditions.  Meningococcal vaccine.** / 1 dose if you are age 33 to 62 years and a Market researcher living in a residence hall, or have one of several medical conditions, you need to get vaccinated against meningococcal disease. You may also need additional booster doses.  Hepatitis A vaccine.** / Consult your health care provider.  Hepatitis B vaccine.** / Consult your health care provider.  Haemophilus influenzae type b (Hib) vaccine.** / Consult your health care provider. Ages 61 to 65 years  Blood pressure check.** / Every year.  Lipid and cholesterol check.** / Every 5 years beginning at age 12 years.  Lung cancer screening. / Every year if you are aged 34-80 years and have a 30-pack-year history of smoking and currently smoke or have quit within the past 15 years. Yearly screening is stopped once you have quit smoking for at least 15 years or develop a health problem that would prevent you from having lung cancer treatment.  Clinical breast exam.** / Every year after age 99 years.  BRCA-related cancer risk assessment.** / For women who have family members with a BRCA-related cancer (breast, ovarian, tubal, or peritoneal cancers).  Mammogram.** / Every year beginning at age 32 years and continuing for as long as you are in good health. Consult with your health care provider.  Pap test.** / Every 3 years starting at age 78 years through age 52 or 67 years with a history of 3 consecutive normal Pap tests.  HPV screening.** / Every 3 years from ages 52 years through ages 14 to 56 years with a history of 3 consecutive normal Pap tests.  Fecal occult blood test (FOBT) of stool. / Every year beginning at age 69 years and continuing until age 59 years. You may not need to do this test if you get  a colonoscopy every 10 years.  Flexible sigmoidoscopy or colonoscopy.** / Every 5 years for a flexible sigmoidoscopy or every 10 years for a colonoscopy beginning at age 31 years and continuing until age 24 years.  Hepatitis C blood test.** / For all people born from [birth year] through 1965 and any individual with known risks for hepatitis C.  Skin self-exam. / Monthly.  Influenza vaccine. / Every year.  Tetanus, diphtheria, and acellular pertussis (Tdap/Td) vaccine.** / Consult your health care provider. Pregnant women should receive 1 dose of Tdap vaccine during each pregnancy. 1 dose of Td every 10 years.  Varicella vaccine.** / Consult your health care provider. Pregnant females who do not have evidence of immunity should receive the first dose after pregnancy.  Zoster vaccine.** / 1 dose for adults aged 72 years or older.  Measles, mumps, rubella (MMR) vaccine.** / You need at least 1 dose of MMR if you were born in 1957 or later. You may also need a second dose. For females of childbearing age, rubella immunity should be determined. If there is no evidence of immunity, females who are not pregnant should be vaccinated. If there is no evidence of immunity, females who are pregnant should delay immunization until after pregnancy.  Pneumococcal 13-valent conjugate (PCV13) vaccine.** / Consult your health care provider.  Pneumococcal polysaccharide (PPSV23) vaccine.** / 1 to 2 doses if you smoke cigarettes or if you have certain conditions.  Meningococcal vaccine.** /  Consult your health care provider.  Hepatitis A vaccine.** / Consult your health care provider.  Hepatitis B vaccine.** / Consult your health care provider.  Haemophilus influenzae type b (Hib) vaccine.** / Consult your health care provider. Ages 64 years and over  Blood pressure check.** / Every year.  Lipid and cholesterol check.** / Every 5 years beginning at age 23 years.  Lung cancer screening. / Every year if you  are aged 16-80 years and have a 30-pack-year history of smoking and currently smoke or have quit within the past 15 years. Yearly screening is stopped once you have quit smoking for at least 15 years or develop a health problem that would prevent you from having lung cancer treatment.  Clinical breast exam.** / Every year after age 74 years.  BRCA-related cancer risk assessment.** / For women who have family members with a BRCA-related cancer (breast, ovarian, tubal, or peritoneal cancers).  Mammogram.** / Every year beginning at age 44 years and continuing for as long as you are in good health. Consult with your health care provider.  Pap test.** / Every 3 years starting at age 58 years through age 22 or 39 years with 3 consecutive normal Pap tests. Testing can be stopped between 65 and 70 years with 3 consecutive normal Pap tests and no abnormal Pap or HPV tests in the past 10 years.  HPV screening.** / Every 3 years from ages 64 years through ages 70 or 61 years with a history of 3 consecutive normal Pap tests. Testing can be stopped between 65 and 70 years with 3 consecutive normal Pap tests and no abnormal Pap or HPV tests in the past 10 years.  Fecal occult blood test (FOBT) of stool. / Every year beginning at age 40 years and continuing until age 27 years. You may not need to do this test if you get a colonoscopy every 10 years.  Flexible sigmoidoscopy or colonoscopy.** / Every 5 years for a flexible sigmoidoscopy or every 10 years for a colonoscopy beginning at age 7 years and continuing until age 32 years.  Hepatitis C blood test.** / For all people born from [birth year] through 1964 and any individual with known risks for hepatitis C.  Osteoporosis screening.** / A one-time screening for women ages 30 years and over and women at risk for fractures or osteoporosis.  Skin self-exam. / Monthly.  Influenza vaccine. / Every year.  Tetanus, diphtheria, and acellular pertussis (Tdap/Td)  vaccine.** / 1 dose of Td every 10 years.  Varicella vaccine.** / Consult your health care provider.  Zoster vaccine.** / 1 dose for adults aged 35 years or older.  Pneumococcal 13-valent conjugate (PCV13) vaccine.** / Consult your health care provider.  Pneumococcal polysaccharide (PPSV23) vaccine.** / 1 dose for all adults aged 46 years and older.  Meningococcal vaccine.** / Consult your health care provider.  Hepatitis A vaccine.** / Consult your health care provider.  Hepatitis B vaccine.** / Consult your health care provider.  Haemophilus influenzae type b (Hib) vaccine.** / Consult your health care provider. ** Family history and personal history of risk and conditions may change your health care provider's recommendations.   This information is not intended to replace advice given to you by your health care provider. Make sure you discuss any questions you have with your health care provider.   Document Released: 07/04/2001 Document Revised: 05/29/2014 Document Reviewed: 10/03/2010 Elsevier Interactive Patient Education [employer].

## 2015-12-07 NOTE — Progress Notes (Signed)
Subjective:     Terri Stevenson is a 60 y.o. female and is here for a comprehensive physical exam. The patient reports problems - had TKR on R and struggling with pain and ROM.  Social History   Social History  . Marital Status: Married    Spouse Name: N/A  . Number of Children: N/A  . Years of Education: N/A   Occupational History  . Not on file.   Social History Main Topics  . Smoking status: Never Smoker   . Smokeless tobacco: Never Used  . Alcohol Use: Yes     Comment: 1 drink per month  . Drug Use: No  . Sexual Activity: Not on file   Other Topics Concern  . Not on file   Social History Narrative   Health Maintenance  Topic Date Due  . Hepatitis C Screening  [birth year]  . HIV Screening  01/15/1972  . INFLUENZA VACCINE  08/05/2016 (Originally 12/21/2015)  . PAP SMEAR  03/24/2017  . MAMMOGRAM  05/05/2017  . TETANUS/TDAP  12/03/2017  . 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 She  does not have any pertinent problems on file. She  has past surgical history that includes Gastric bypass; Cesarean section; Shoulder surgery; right knee arthroscopy ; biopsy of uterus; and Total knee arthroplasty (Right, 10/01/2015). Her family history includes Breast cancer in her mother; COPD in her father; Cancer in her mother; Diabetes in her brother; Heart disease in her brother and father; Hyperlipidemia in her brother and father; Hypertension in her brother and father. She  reports that she has never smoked. She has never used smokeless tobacco. She reports that she drinks alcohol. She reports that she does not use illicit drugs. She has a current medication list which includes the following prescription(s): alprazolam, aspirin ec, calcium carbonate, vitamin d3, duexis, ferrous sulfate, medroxyprogesterone, methocarbamol, multivitamin adult,  probiotic product, vitamin b-12, and naltrexone-bupropion hcl er. Current Outpatient Prescriptions on File Prior to Visit  Medication Sig Dispense Refill  . aspirin EC 325 MG tablet Take 1 tablet (325 mg total) by mouth 2 (two) times daily. 60 tablet 0  . calcium carbonate (OS-CAL) 600 MG TABS Take 600 mg by mouth 2 (two) times daily.     . Cholecalciferol (VITAMIN D3) 5000 units TABS Take 5,000 Units by mouth daily.    . ferrous sulfate 325 (65 FE) MG tablet Take 325 mg by mouth daily with breakfast.    . medroxyPROGESTERone (PROVERA) 10 MG tablet Take 10 mg by mouth See admin instructions. She is to take daily for 14 days for a total of 3 months.    . methocarbamol (ROBAXIN) 500 MG tablet Take 1 tablet (500 mg total) by mouth every 8 (eight) hours as needed for muscle spasms. 50 tablet 2  . Multiple Vitamins-Minerals (MULTIVITAMIN ADULT) CHEW Chew 1 tablet by mouth daily.    . Probiotic Product (ALIGN PO) Take 1 capsule by mouth daily as needed (For digestive support.).     Marland Kitchen vitamin B-12 (CYANOCOBALAMIN) 500 MCG tablet Take 500 mcg by mouth daily.    . Naltrexone-Bupropion HCl ER (CONTRAVE) 8-90 MG TB12 Take 2 tablets by mouth 2 (two) times daily. (Patient not taking: Reported on 12/07/2015) 120 tablet 2   No current facility-administered medications on file prior to visit.   She is allergic to xarelto.Marland Kitchen  Review of Systems Review of Systems  Constitutional: Negative for activity change, appetite change and fatigue.  HENT: Negative for hearing loss, congestion, tinnitus and ear discharge.  dentist q55m Eyes: Negative for visual disturbance (see optho q1y -- vision corrected to 20/20 with glasses).  Respiratory: Negative for cough, chest tightness and shortness of breath.   Cardiovascular: Negative for chest pain, palpitations and leg swelling.  Gastrointestinal: Negative for abdominal pain, diarrhea, constipation and abdominal distention.  Genitourinary: Negative for urgency, frequency,  decreased urine volume and difficulty urinating.  Musculoskeletal: Negative for back pain, arthralgias and gait problem.  Skin: Negative for color change, pallor and rash.  Neurological: Negative for dizziness, light-headedness, numbness and headaches.  Hematological: Negative for adenopathy. Does not bruise/bleed easily.  Psychiatric/Behavioral: Negative for suicidal ideas, confusion, sleep disturbance, self-injury, dysphoric mood, decreased concentration and agitation.      Objective:    BP 122/78 mmHg  Pulse 61  Temp(Src) 98.3 F (36.8 C) (Oral)  Ht 5\' 4"  (1.626 m)  Wt 212 lb 3.2 oz (96.253 kg)  BMI 36.41 kg/m2  SpO2 99%  LMP 11/29/2015 General appearance: alert, cooperative, appears stated age and no distress Head: Normocephalic, without obvious abnormality, atraumatic Eyes: conjunctivae/corneas clear. PERRL, EOM's intact. Fundi benign. Ears: normal TM's and external ear canals both ears Nose: Nares normal. Septum midline. Mucosa normal. No drainage or sinus tenderness. Throat: lips, mucosa, and tongue normal; teeth and gums normal Neck: no adenopathy, no carotid bruit, no JVD, supple, symmetrical, trachea midline and thyroid not enlarged, symmetric, no tenderness/mass/nodules Back: symmetric, no curvature. ROM normal. No CVA tenderness. Lungs: clear to auscultation bilaterally Breasts: gyn Heart: regular rate and rhythm, S1, S2 normal, no murmur, click, rub or gallop Abdomen: soft, non-tender; bowel sounds normal; no masses,  no organomegaly Pelvic: deferred-gyn Extremities: edema nonpitting--- no change from previous                         Pain r knee with dec rom Pulses: 2+ and symmetric Skin: Skin color, texture, turgor normal. No rashes or lesions Lymph nodes: Cervical, supraclavicular, and axillary nodes normal. Neurologic: Alert and oriented X 3, normal strength and tone. Normal symmetric reflexes. Normal coordination and gait    Assessment:    Healthy female  exam.      Plan:    ghm utd Check labs See After Visit Summary for Counseling Recommendations

## 2015-12-08 LAB — HEPATITIS C ANTIBODY: HCV Ab: NEGATIVE

## 2015-12-14 ENCOUNTER — Telehealth: Payer: Self-pay | Admitting: Behavioral Health

## 2015-12-14 DIAGNOSIS — E785 Hyperlipidemia, unspecified: Secondary | ICD-10-CM

## 2015-12-14 NOTE — Telephone Encounter (Signed)
Notes Recorded by Ann Held, DO on 12/11/2015 at 11:15 AM Cholesterol--- LDL goal < 100, HDL >40, TG < 150. Diet and exercise will increase HDL and decrease LDL and TG. Fish, Fish Oil, Flaxseed oil will also help increase the HDL and decrease Triglycerides.  Recheck labs in 6 months.   Patient was made aware of lab results and provider's recommendations. She verbalized understanding. Orders placed for repeat labs in 6 months. Appointment scheduled for 06/15/16 at 7:00 AM.

## 2016-02-15 ENCOUNTER — Encounter: Payer: Self-pay | Admitting: Podiatry

## 2016-02-15 ENCOUNTER — Ambulatory Visit: Payer: Managed Care, Other (non HMO) | Admitting: Podiatry

## 2016-02-15 ENCOUNTER — Ambulatory Visit (HOSPITAL_BASED_OUTPATIENT_CLINIC_OR_DEPARTMENT_OTHER)
Admission: RE | Admit: 2016-02-15 | Discharge: 2016-02-15 | Disposition: A | Discharge status: Home / Self Care | Payer: Managed Care, Other (non HMO) | Source: Ambulatory Visit | Attending: Podiatry | Admitting: Podiatry

## 2016-02-15 ENCOUNTER — Ambulatory Visit (INDEPENDENT_AMBULATORY_CARE_PROVIDER_SITE_OTHER): Payer: Managed Care, Other (non HMO) | Admitting: Podiatry

## 2016-02-15 DIAGNOSIS — M19071 Primary osteoarthritis, right ankle and foot: Secondary | ICD-10-CM | POA: Insufficient documentation

## 2016-02-15 DIAGNOSIS — M7731 Calcaneal spur, right foot: Secondary | ICD-10-CM | POA: Insufficient documentation

## 2016-02-15 DIAGNOSIS — M7732 Calcaneal spur, left foot: Secondary | ICD-10-CM | POA: Insufficient documentation

## 2016-02-15 DIAGNOSIS — R52 Pain, unspecified: Secondary | ICD-10-CM | POA: Insufficient documentation

## 2016-02-15 DIAGNOSIS — M722 Plantar fascial fibromatosis: Secondary | ICD-10-CM | POA: Diagnosis not present

## 2016-02-15 MED ORDER — MELOXICAM 15 MG PO TABS: 15.0000 mg | ORAL_TABLET | Freq: Every day | ORAL | 2 refills | Status: AC

## 2016-02-15 NOTE — Progress Notes (Signed)
   Subjective:    Patient ID: Terri Stevenson, female    DOB: [birth year], 60 y.o.   MRN: [id number]  HPI  60 year old female presents the office today for concerns of pain in the bottoms of both of her heels which is been ongoing for approximately 3 weeks. She states that she has pain when she first gets up the pain is becoming more consistent where she has a difficult to put weight to either for heels at times. She denies any redness or swelling. She denies any recent injury or trauma. Pain does not wake up at night. She has tried changing the shoes that any relief. No other complaints.  Review of Systems  All other systems reviewed and are negative.      Objective:   Physical Exam General: AAO x3, NAD  Dermatological: Skin is warm, dry and supple bilateral. Nails x 10 are well manicured; remaining integument appears unremarkable at this time. There are no open sores, no preulcerative lesions, no rash or signs of infection present.  Vascular: Dorsalis Pedis artery and Posterior Tibial artery pedal pulses are 2/4 bilateral with immedate capillary fill time. There is no pain with calf compression, swelling, warmth, erythema.   Neruologic: Grossly intact via light touch bilateral. Vibratory intact via tuning fork bilateral. Protective threshold with Semmes Wienstein monofilament intact to all pedal sites bilateral.    Musculoskeletal: Tenderness to palpation along the plantar medial tubercle of the calcaneus at the insertion of plantar fascia on the right > left foot. There is no pain along the course of the plantar fascia within the arch of the foot. Plantar fascia appears to be intact. There is no pain with lateral compression of the calcaneus or pain with vibratory sensation. There is no pain along the course or insertion of the achilles tendon. No other areas of tenderness to bilateral lower extremities. Muscular strength 5/5 in all groups tested bilateral.  Gait: Unassisted, Nonantalgic.       Assessment & Plan:  Bilateral heel pain, likely plantar fasciitis, heel spurs -Treatment options discussed including all alternatives, risks, and complications -Etiology of symptoms were discussed -X-rays were obtained and reviewed with the patient. No evidence of acute fracture. Large inferior calcaneal spurring is present. -Patient elects to proceed with steroid injection into the left and right heel. Under sterile skin preparation, a total of 2.5cc of kenalog 10, 0.5% Marcaine plain, and 2% lidocaine plain were infiltrated into the symptomatic area without complication. A band-aid was applied. Patient tolerated the injection well without complication. Post-injection care with discussed with the patient. Discussed with the patient to ice the area over the next couple of days to help prevent a steroid flare.  -Plantar fascial tapings applied -Prescribed mobic. Discussed side effects of the medication and directed to stop if any are to occur and call the office.  -Stretching, icing exercises daily -Discussed shoe modifications. Also discussed custom inserts. -Follow-up in 3 weeks or sooner if any problems arise. In the meantime, encouraged to call the office with any questions, concerns, change in symptoms.   Celesta Gentile, DPM

## 2016-02-15 NOTE — Patient Instructions (Signed)

## 2016-02-16 DIAGNOSIS — M722 Plantar fascial fibromatosis: Secondary | ICD-10-CM | POA: Insufficient documentation

## 2016-03-07 ENCOUNTER — Encounter: Payer: Self-pay | Admitting: Podiatry

## 2016-03-07 ENCOUNTER — Other Ambulatory Visit: Payer: Self-pay | Admitting: Obstetrics and Gynecology

## 2016-03-07 ENCOUNTER — Ambulatory Visit (INDEPENDENT_AMBULATORY_CARE_PROVIDER_SITE_OTHER): Payer: Managed Care, Other (non HMO) | Admitting: Podiatry

## 2016-03-07 DIAGNOSIS — M722 Plantar fascial fibromatosis: Secondary | ICD-10-CM

## 2016-03-07 DIAGNOSIS — N6310 Unspecified lump in the right breast, unspecified quadrant: Secondary | ICD-10-CM

## 2016-03-07 NOTE — Progress Notes (Signed)
Subjective: Terri Stevenson presents to the office today for follow-up evaluation of bilateral heel pain. She states the left side still hurts the right side is doing well. The left side is substantially better she still having some discomfort. She states that she go for 30 minute exercise activity without any pain to when she was walking over the weekend for an hour she started get pain to the heel into the arch of the foot on the left side. She has been stretching, icing. She does have an orthotic that she's had made previously she only wears on the left side. No other complaints at this time. No acute changes since last appointment. They deny any systemic complaints such as fevers, chills, nausea, vomiting.  Objective: General: AAO x3, NAD  Dermatological: Skin is warm, dry and supple bilateral. Nails x 10 are well manicured; remaining integument appears unremarkable at this time. There are no open sores, no preulcerative lesions, no rash or signs of infection present.  Vascular: Dorsalis Pedis artery and Posterior Tibial artery pedal pulses are 2/4 bilateral with immedate capillary fill time. Pedal hair growth present. There is no pain with calf compression, swelling, warmth, erythema.   Neruologic: Grossly intact via light touch bilateral. Vibratory intact via tuning fork bilateral. Protective threshold with Semmes Wienstein monofilament intact to all pedal sites bilateral.   Musculoskeletal: There is improved but continued tenderness palpation along the plantar medial tubercle of the calcaneus at the insertion of the plantar fascia on the left foot. There is no pain on the right There is no pain along the course of the plantar fascia within the arch of the foot. Plantar fascia appears to be intact bilaterally. There is no pain with lateral compression of the calcaneus and there is no pain with vibratory sensation. There is no pain along the course or insertion of the Achilles tendon. There are no  other areas of tenderness to bilateral lower extremities. No gross boney pedal deformities bilateral. No pain, crepitus, or limitation noted with foot and ankle range of motion bilateral. Muscular strength 5/5 in all groups tested bilateral.  Gait: Unassisted, Nonantalgic.   Assessment: Presents for follow-up evaluation for heel pain, likely plantar fasciitis   Plan: -Treatment options discussed including all alternatives, risks, and complications -Patient elects to proceed with steroid injection into the left heel. Under sterile skin preparation, a total of 2.5cc of kenalog 10, 0.5% Marcaine plain, and 2% lidocaine plain were infiltrated into the symptomatic area without complication. A band-aid was applied. Patient tolerated the injection well without complication. Post-injection care with discussed with the patient. Discussed with the patient to ice the area over the next couple of days to help prevent a steroid flare.  -Plantar fascial braces dispensed bilaterally -Ice and stretching exercises on a daily basis. -Continue supportive shoe gear. -Continue orthotics I discussed the [facility] do custom insert. She is only wearing the insert and the left-sided recommend her to wear it on both sides. -Follow-up in 4 weeks or sooner if any problems arise. In the meantime, encouraged to call the office with any questions, concerns, change in symptoms.   Celesta Gentile, DPM

## 2016-03-15 ENCOUNTER — Ambulatory Visit
Admission: RE | Admit: 2016-03-15 | Discharge: 2016-03-15 | Disposition: A | Discharge status: Home / Self Care | Payer: Managed Care, Other (non HMO) | Source: Ambulatory Visit | Attending: Obstetrics and Gynecology | Admitting: Obstetrics and Gynecology

## 2016-03-15 DIAGNOSIS — N6310 Unspecified lump in the right breast, unspecified quadrant: Secondary | ICD-10-CM

## 2016-04-11 ENCOUNTER — Other Ambulatory Visit: Payer: Self-pay | Admitting: Obstetrics and Gynecology

## 2016-04-11 DIAGNOSIS — Z1231 Encounter for screening mammogram for malignant neoplasm of breast: Secondary | ICD-10-CM

## 2016-05-16 ENCOUNTER — Ambulatory Visit
Admission: RE | Admit: 2016-05-16 | Discharge: 2016-05-16 | Disposition: A | Discharge status: Home / Self Care | Payer: Managed Care, Other (non HMO) | Source: Ambulatory Visit | Attending: Obstetrics and Gynecology | Admitting: Obstetrics and Gynecology

## 2016-05-16 DIAGNOSIS — Z1231 Encounter for screening mammogram for malignant neoplasm of breast: Secondary | ICD-10-CM

## 2016-05-18 IMAGING — MG MM DIGITAL SCREENING BILAT W/ CAD
4 series · 4 of 4 positions shown · non-contrast
Comparison: Previous exam(s).

CLINICAL DATA: Screening.

EXAM:
DIGITAL SCREENING BILATERAL MAMMOGRAM WITH CAD

[R CC]
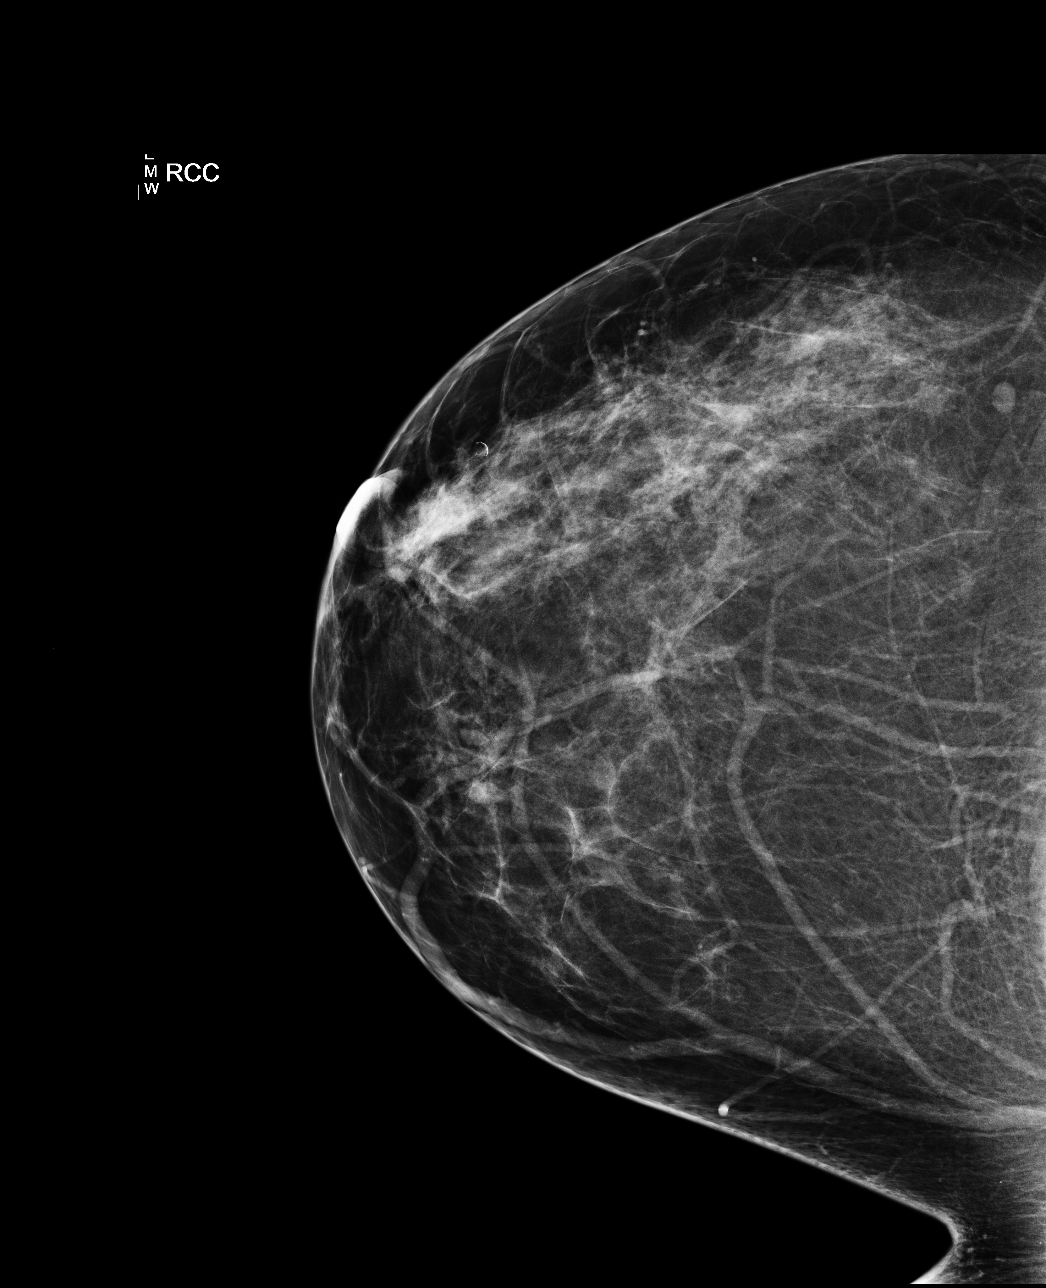

[L CC]
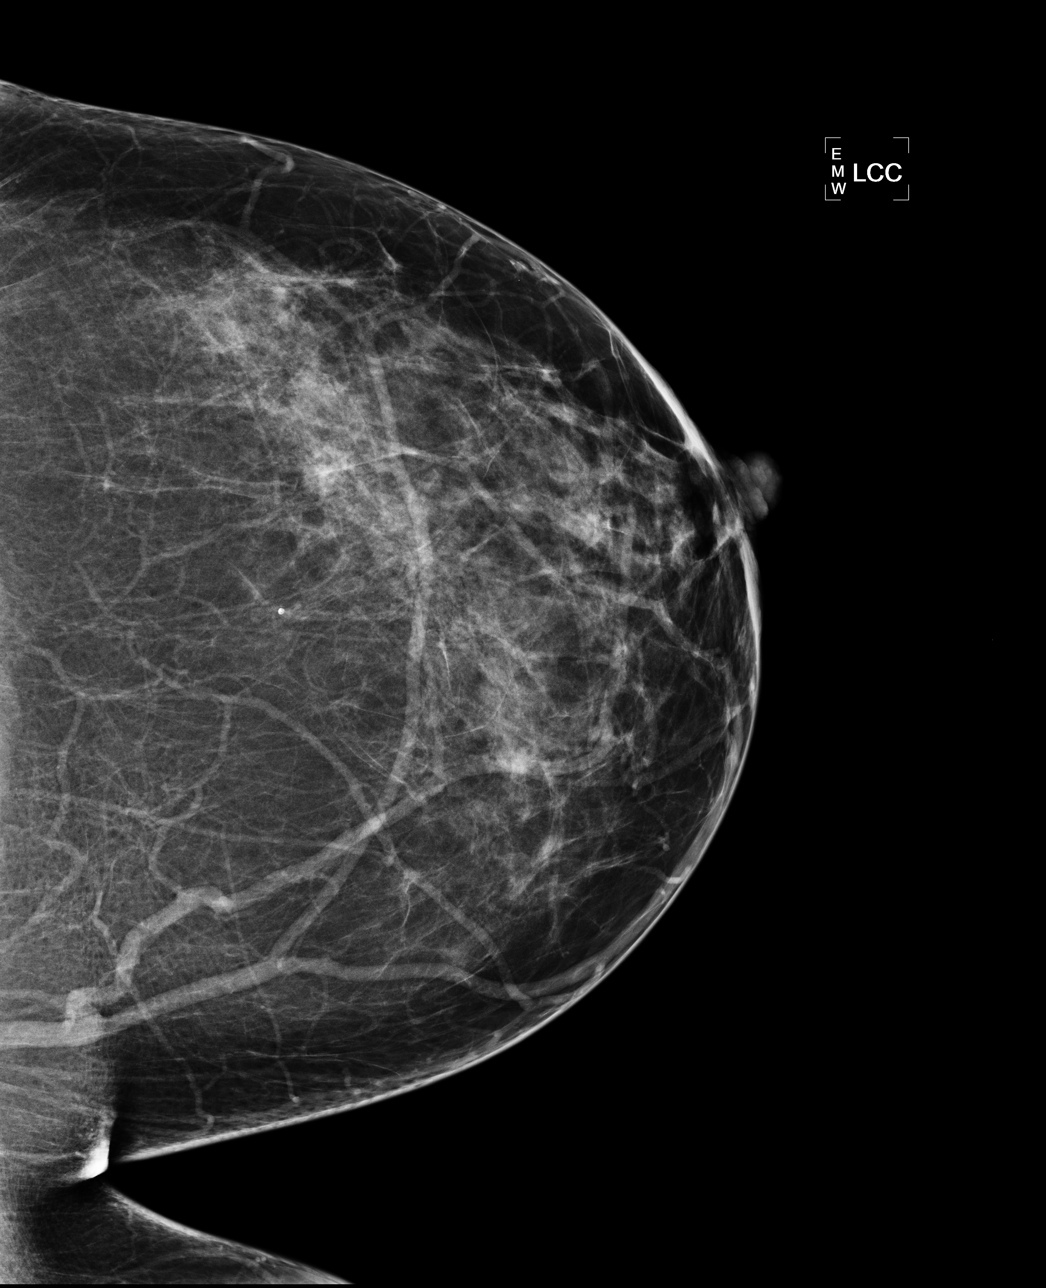

[L MLO]
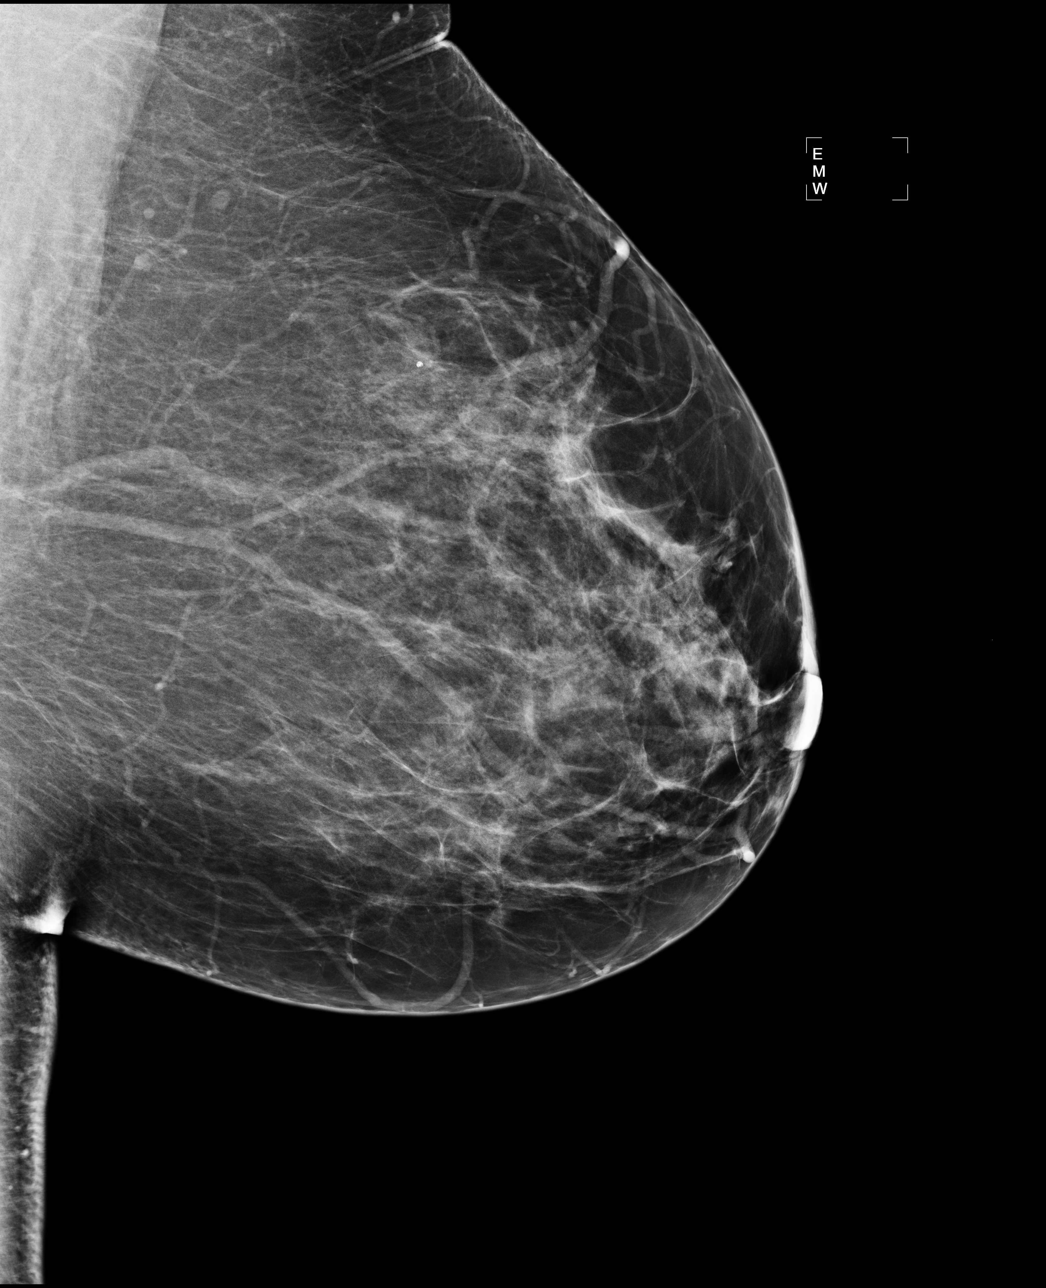

[R MLO]
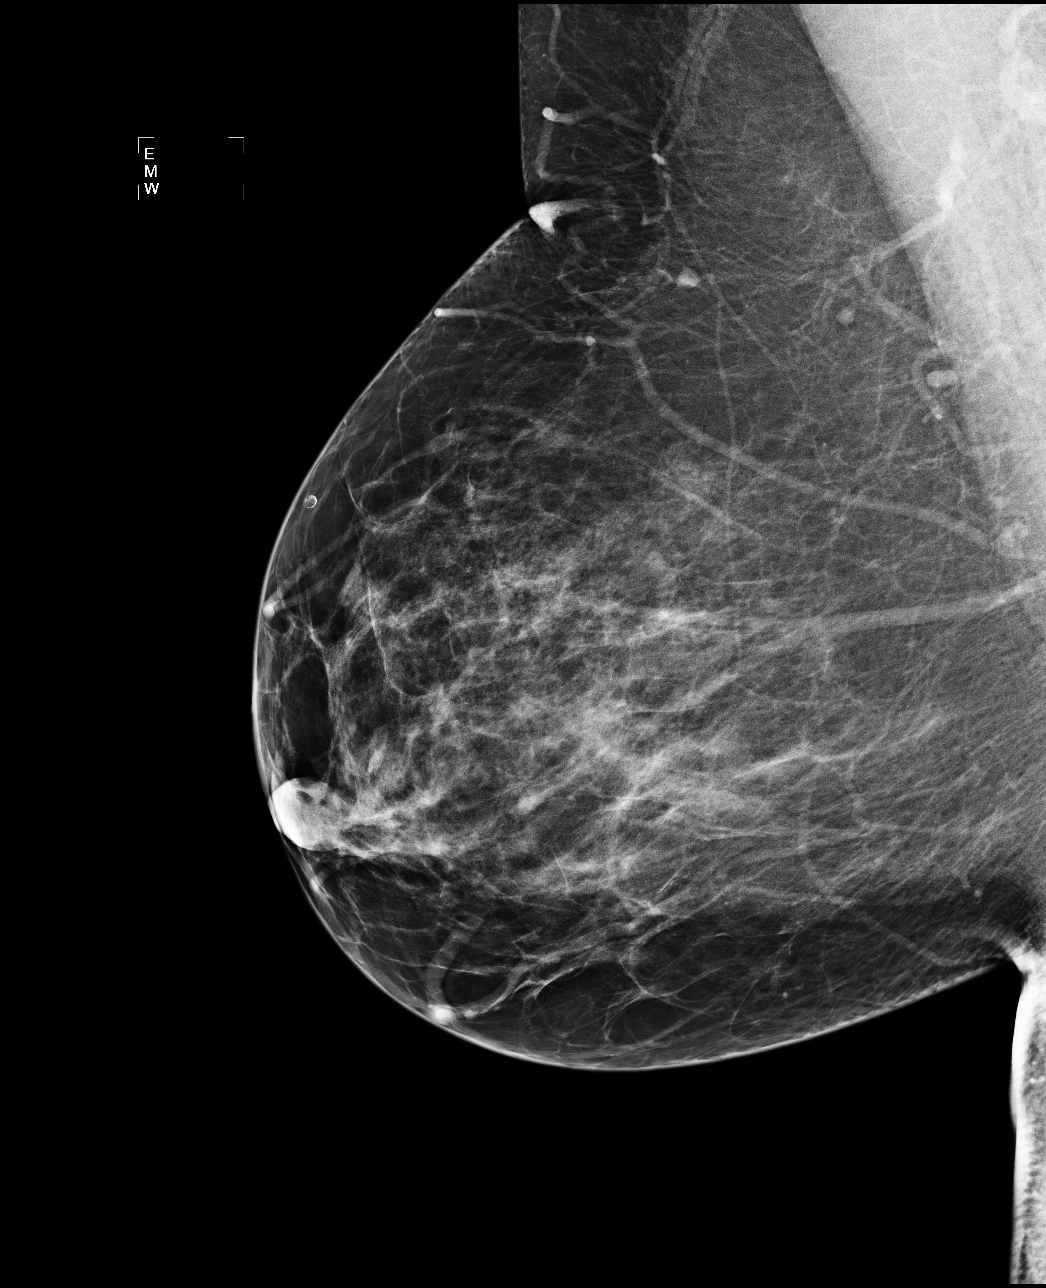

[4 of 4 positions shown; findings below may reference images not displayed]

ACR Breast Density Category b: There are scattered areas of
fibroglandular density.
FINDINGS: There are no findings suspicious for malignancy. Images were
processed with CAD.
IMPRESSION: No mammographic evidence of malignancy. A result letter of this
screening mammogram will be mailed directly to the patient.

RECOMMENDATION:
Screening mammogram in one year. (Code:AS-G-LCT)

BI-RADS CATEGORY  1: Negative.

## 2016-06-15 ENCOUNTER — Other Ambulatory Visit (INDEPENDENT_AMBULATORY_CARE_PROVIDER_SITE_OTHER): Payer: Managed Care, Other (non HMO)

## 2016-06-15 DIAGNOSIS — E785 Hyperlipidemia, unspecified: Secondary | ICD-10-CM

## 2016-06-15 LAB — COMPLETE METABOLIC PANEL WITH GFR
ALT: 9 U/L (ref 6–29)
AST: 11 U/L (ref 10–35)
Albumin: 3.7 g/dL (ref 3.6–5.1)
Alkaline Phosphatase: 72 U/L (ref 33–130)
BUN: 15 mg/dL (ref 7–25)
CHLORIDE: 106 mmol/L (ref 98–110)
CO2: 30 mmol/L (ref 20–31)
CREATININE: 0.8 mg/dL (ref 0.50–1.05)
Calcium: 9.3 mg/dL (ref 8.6–10.4)
GFR, EST NON AFRICAN AMERICAN: 81 mL/min (ref >=60)
GFR, Est African American: >89 mL/min (ref >=60)
Glucose, Bld: 96 mg/dL (ref 65–99)
Potassium: 4.7 mmol/L (ref 3.5–5.3)
Sodium: 141 mmol/L (ref 135–146)
Total Bilirubin: 0.8 mg/dL (ref 0.2–1.2)
Total Protein: 6.3 g/dL (ref 6.1–8.1)

## 2016-06-15 LAB — LIPID PANEL
CHOL/HDL RATIO: 3
Cholesterol: 191 mg/dL (ref 0–200)
HDL: 65.9 mg/dL (ref >39.00)
LDL Cholesterol: 114 mg/dL — ABNORMAL HIGH (ref 0–99)
NONHDL: 125.3
Triglycerides: 59 mg/dL (ref 0.0–149.0)
VLDL: 11.8 mg/dL (ref 0.0–40.0)

## 2016-07-07 ENCOUNTER — Encounter: Payer: Self-pay | Admitting: Family Medicine

## 2016-07-07 MED ORDER — ALPRAZOLAM 0.25 MG PO TABS: 0.2500 mg | ORAL_TABLET | Freq: Three times a day (TID) | ORAL | 0 refills | Status: AC | PRN

## 2016-07-07 NOTE — Telephone Encounter (Signed)
Rx printed, awaiting DO signature.  

## 2016-07-07 NOTE — Telephone Encounter (Signed)
Rx faxed to [facility].  

## 2016-07-07 NOTE — Telephone Encounter (Signed)
Ok to refill x 1  

## 2018-05-13 IMAGING — DX DG FOOT COMPLETE 3+V*L*
3 series · 3 of 3 positions shown · non-contrast
Comparison: None.

CLINICAL DATA: Bilateral heel pain for 3-1/2 weeks. No known
injury. Initial encounter.

EXAM:
LEFT FOOT - COMPLETE 3+ VIEW

[foot ap]
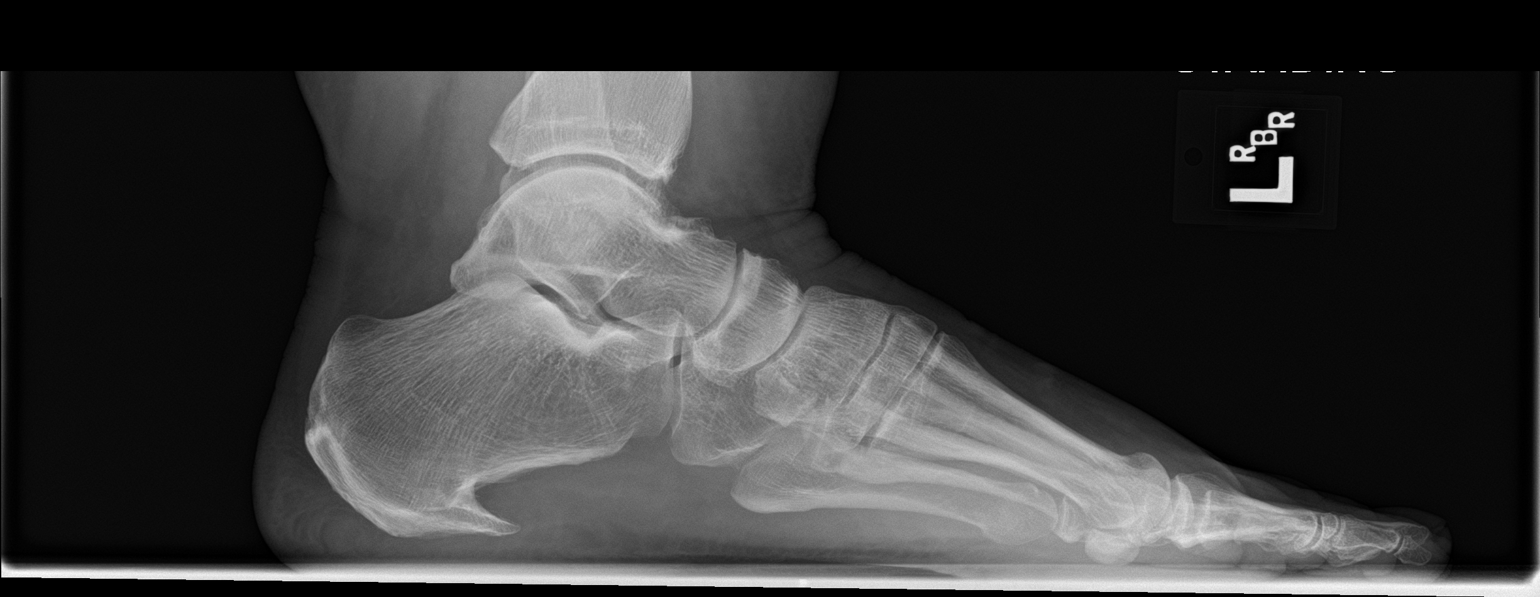

[foot lat]
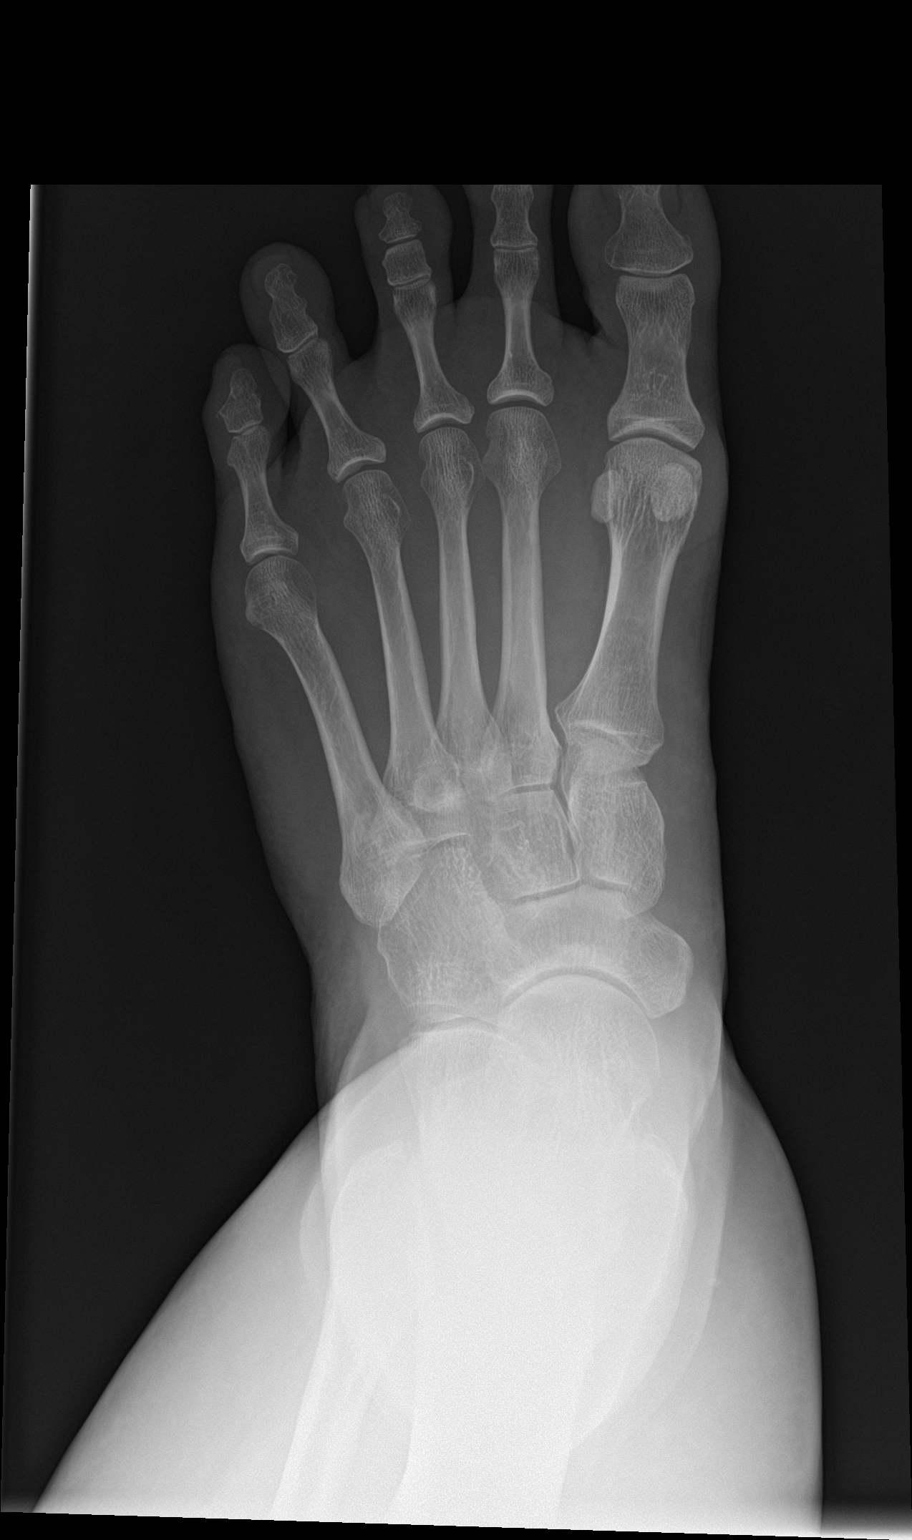

[foot obl]
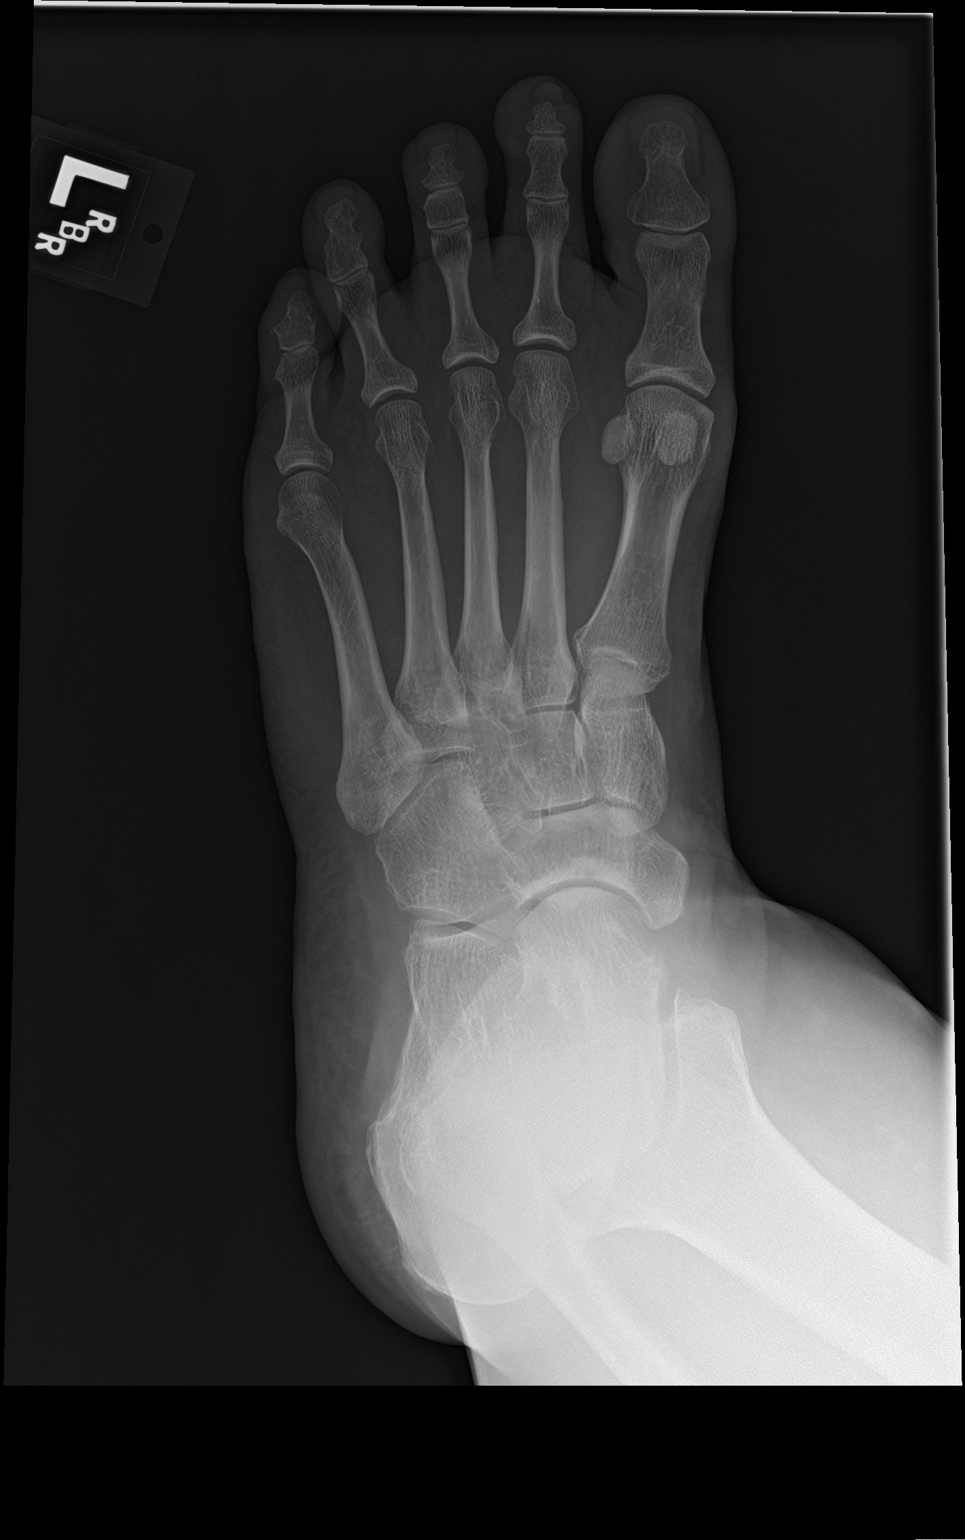

[3 of 3 positions shown; findings below may reference images not displayed]

FINDINGS: No acute bony or joint abnormality is seen. Large plantar calcaneal
spur is identified. The third and fourth toes are splayed. Soft
tissues are unremarkable.
IMPRESSION: No acute finding.

Large plantar calcaneal spur.
